# Patient Record
Sex: Female | Born: 1985 | Hispanic: Yes | Smoking: Former smoker
Health system: Southern US, Community
[De-identification: ages and names within clinical notes are randomized; demographics above are authoritative.]

## PROBLEM LIST (undated history)

## (undated) DIAGNOSIS — L409 Psoriasis, unspecified: Secondary | ICD-10-CM

## (undated) DIAGNOSIS — E0500 Thyrotoxicosis with diffuse goiter without thyrotoxic crisis or storm: Secondary | ICD-10-CM

## (undated) HISTORY — PX: TUBAL LIGATION: SHX77

---

## 2005-12-25 ENCOUNTER — Emergency Department: Payer: Self-pay | Admitting: Emergency Medicine

## 2006-07-20 ENCOUNTER — Emergency Department: Payer: Self-pay | Admitting: Unknown Physician Specialty

## 2007-05-28 ENCOUNTER — Emergency Department: Payer: Self-pay | Admitting: Emergency Medicine

## 2007-09-03 ENCOUNTER — Other Ambulatory Visit: Payer: Self-pay

## 2007-09-03 ENCOUNTER — Emergency Department: Payer: Self-pay | Admitting: Emergency Medicine

## 2008-11-13 ENCOUNTER — Emergency Department: Payer: Self-pay | Admitting: Emergency Medicine

## 2010-04-13 ENCOUNTER — Emergency Department: Payer: Self-pay | Admitting: Emergency Medicine

## 2010-10-23 ENCOUNTER — Emergency Department: Payer: Self-pay | Admitting: Emergency Medicine

## 2011-07-04 ENCOUNTER — Emergency Department: Payer: Self-pay | Admitting: Emergency Medicine

## 2011-08-02 ENCOUNTER — Emergency Department: Payer: Self-pay

## 2012-01-18 ENCOUNTER — Emergency Department: Payer: Self-pay | Admitting: Emergency Medicine

## 2012-01-18 LAB — URINALYSIS, COMPLETE
Bacteria: NONE SEEN
Bilirubin,UR: NEGATIVE
Ketone: NEGATIVE
Nitrite: NEGATIVE
Squamous Epithelial: 1
WBC UR: 17 /HPF (ref 0–5)

## 2012-04-06 ENCOUNTER — Emergency Department: Payer: Self-pay | Admitting: Emergency Medicine

## 2012-04-06 LAB — COMPREHENSIVE METABOLIC PANEL
Albumin: 4.2 g/dL (ref 3.4–5.0)
Anion Gap: 8 (ref 7–16)
Calcium, Total: 8.6 mg/dL (ref 8.5–10.1)
Chloride: 110 mmol/L — ABNORMAL HIGH (ref 98–107)
Co2: 26 mmol/L (ref 21–32)
EGFR (African American): >60
Glucose: 73 mg/dL (ref 65–99)
Osmolality: 284 (ref 275–301)
Potassium: 3.6 mmol/L (ref 3.5–5.1)
SGOT(AST): 26 U/L (ref 15–37)
Sodium: 144 mmol/L (ref 136–145)
Total Protein: 7.7 g/dL (ref 6.4–8.2)

## 2012-04-06 LAB — CBC
MCH: 30.1 pg (ref 26.0–34.0)
MCHC: 34 g/dL (ref 32.0–36.0)
MCV: 89 fL (ref 80–100)
Platelet: 242 10*3/uL (ref 150–440)
RBC: 4.81 10*6/uL (ref 3.80–5.20)
WBC: 9.1 10*3/uL (ref 3.6–11.0)

## 2012-04-06 LAB — URINALYSIS, COMPLETE
Bacteria: NONE SEEN
Bilirubin,UR: NEGATIVE
Blood: NEGATIVE
Glucose,UR: NEGATIVE mg/dL
Ketone: NEGATIVE
Nitrite: NEGATIVE
Ph: 6
Protein: NEGATIVE
RBC,UR: NONE SEEN /HPF
Specific Gravity: 1.014
Squamous Epithelial: 1
WBC UR: 2 /HPF

## 2012-04-06 LAB — PREGNANCY, URINE: Pregnancy Test, Urine: NEGATIVE m[IU]/mL

## 2012-06-19 ENCOUNTER — Emergency Department: Payer: Self-pay | Admitting: Emergency Medicine

## 2012-09-20 ENCOUNTER — Emergency Department: Payer: Self-pay | Admitting: Emergency Medicine

## 2012-09-20 LAB — CBC
HCT: 46.8 % (ref 35.0–47.0)
HGB: 15.8 g/dL (ref 12.0–16.0)
MCHC: 33.6 g/dL (ref 32.0–36.0)
MCV: 86 fL (ref 80–100)
RDW: 12.8 % (ref 11.5–14.5)

## 2012-09-20 LAB — COMPREHENSIVE METABOLIC PANEL
Albumin: 4.7 g/dL (ref 3.4–5.0)
Alkaline Phosphatase: 114 U/L (ref 50–136)
Anion Gap: 6 — ABNORMAL LOW (ref 7–16)
Bilirubin,Total: 0.4 mg/dL (ref 0.2–1.0)
Calcium, Total: 9.8 mg/dL (ref 8.5–10.1)
Chloride: 108 mmol/L — ABNORMAL HIGH (ref 98–107)
Co2: 25 mmol/L (ref 21–32)
EGFR (African American): >60
EGFR (Non-African Amer.): >60
Glucose: 88 mg/dL (ref 65–99)
Osmolality: 276 (ref 275–301)
Potassium: 3.9 mmol/L (ref 3.5–5.1)
SGPT (ALT): 20 U/L (ref 12–78)
Total Protein: 8.7 g/dL — ABNORMAL HIGH (ref 6.4–8.2)

## 2012-09-20 LAB — URINALYSIS, COMPLETE
Bilirubin,UR: NEGATIVE
Blood: NEGATIVE
Ph: 8 (ref 4.5–8.0)
Protein: 100
RBC,UR: 3 /HPF (ref 0–5)
Specific Gravity: 1.027 (ref 1.003–1.030)
Squamous Epithelial: 6
WBC UR: 2 /HPF (ref 0–5)

## 2012-10-18 ENCOUNTER — Emergency Department: Payer: Self-pay | Admitting: Emergency Medicine

## 2012-10-18 LAB — COMPREHENSIVE METABOLIC PANEL
Albumin: 4.2 g/dL (ref 3.4–5.0)
Calcium, Total: 9.8 mg/dL (ref 8.5–10.1)
EGFR (African American): >60
EGFR (Non-African Amer.): >60
Glucose: 90 mg/dL (ref 65–99)
Osmolality: 273 (ref 275–301)
Potassium: 4 mmol/L (ref 3.5–5.1)
SGOT(AST): 24 U/L (ref 15–37)
Sodium: 138 mmol/L (ref 136–145)

## 2012-10-18 LAB — CBC
HGB: 15.3 g/dL (ref 12.0–16.0)
MCH: 29.5 pg (ref 26.0–34.0)
MCHC: 34.7 g/dL (ref 32.0–36.0)
RDW: 12.9 % (ref 11.5–14.5)
WBC: 17.3 10*3/uL — ABNORMAL HIGH (ref 3.6–11.0)

## 2012-10-18 LAB — URINALYSIS, COMPLETE
Bilirubin,UR: NEGATIVE
Nitrite: POSITIVE
Protein: 75
RBC,UR: 501 /HPF (ref 0–5)
Specific Gravity: 1.02 (ref 1.003–1.030)
Squamous Epithelial: 4
Transitional Epi: 1
WBC UR: 2173 /HPF (ref 0–5)

## 2012-10-18 LAB — WET PREP, GENITAL

## 2012-10-18 LAB — LIPASE, BLOOD: Lipase: 124 U/L (ref 73–393)

## 2012-10-18 LAB — PREGNANCY, URINE: Pregnancy Test, Urine: NEGATIVE m[IU]/mL

## 2012-10-18 LAB — GC/CHLAMYDIA PROBE AMP

## 2012-10-20 LAB — URINE CULTURE

## 2012-10-23 LAB — CULTURE, BLOOD (SINGLE)

## 2013-10-01 ENCOUNTER — Emergency Department: Payer: Self-pay | Admitting: Emergency Medicine

## 2013-10-01 LAB — URINALYSIS, COMPLETE
Bacteria: NONE SEEN
Bilirubin,UR: NEGATIVE
Blood: NEGATIVE
GLUCOSE, UR: NEGATIVE mg/dL (ref 0–75)
KETONE: NEGATIVE
LEUKOCYTE ESTERASE: NEGATIVE
NITRITE: NEGATIVE
PH: 5 (ref 4.5–8.0)
Protein: NEGATIVE
SPECIFIC GRAVITY: 1.025 (ref 1.003–1.030)
WBC UR: 1 /HPF (ref 0–5)

## 2013-10-01 LAB — COMPREHENSIVE METABOLIC PANEL
ALK PHOS: 102 U/L
ALT: 17 U/L (ref 12–78)
AST: 18 U/L (ref 15–37)
Albumin: 3.8 g/dL (ref 3.4–5.0)
Anion Gap: 6 — ABNORMAL LOW (ref 7–16)
BUN: 10 mg/dL (ref 7–18)
Bilirubin,Total: 0.2 mg/dL (ref 0.2–1.0)
CALCIUM: 9.5 mg/dL (ref 8.5–10.1)
CO2: 28 mmol/L (ref 21–32)
Chloride: 104 mmol/L (ref 98–107)
Creatinine: 0.67 mg/dL (ref 0.60–1.30)
EGFR (African American): >60
Glucose: 77 mg/dL (ref 65–99)
OSMOLALITY: 274 (ref 275–301)
Potassium: 3.6 mmol/L (ref 3.5–5.1)
SODIUM: 138 mmol/L (ref 136–145)
Total Protein: 7.9 g/dL (ref 6.4–8.2)

## 2013-10-01 LAB — CBC WITH DIFFERENTIAL/PLATELET
BASOS ABS: 0.1 10*3/uL (ref 0.0–0.1)
BASOS PCT: 0.5 %
Eosinophil #: 0.4 10*3/uL (ref 0.0–0.7)
Eosinophil %: 2.8 %
HCT: 44.5 % (ref 35.0–47.0)
HGB: 14.5 g/dL (ref 12.0–16.0)
LYMPHS ABS: 2.6 10*3/uL (ref 1.0–3.6)
Lymphocyte %: 16.8 %
MCH: 29.2 pg (ref 26.0–34.0)
MCHC: 32.6 g/dL (ref 32.0–36.0)
MCV: 90 fL (ref 80–100)
MONOS PCT: 6.2 %
Monocyte #: 1 x10 3/mm — ABNORMAL HIGH (ref 0.2–0.9)
Neutrophil #: 11.3 10*3/uL — ABNORMAL HIGH (ref 1.4–6.5)
Neutrophil %: 73.7 %
PLATELETS: 258 10*3/uL (ref 150–440)
RBC: 4.95 10*6/uL (ref 3.80–5.20)
RDW: 12.6 % (ref 11.5–14.5)
WBC: 15.3 10*3/uL — ABNORMAL HIGH (ref 3.6–11.0)

## 2013-10-01 LAB — WET PREP, GENITAL

## 2013-10-01 LAB — GC/CHLAMYDIA PROBE AMP

## 2013-12-30 ENCOUNTER — Emergency Department: Payer: Self-pay | Admitting: Internal Medicine

## 2014-03-07 ENCOUNTER — Emergency Department: Payer: Self-pay | Admitting: Emergency Medicine

## 2014-03-07 LAB — CBC WITH DIFFERENTIAL/PLATELET
Basophil #: 0 10*3/uL (ref 0.0–0.1)
Basophil %: 0.6 %
EOS PCT: 1.8 %
Eosinophil #: 0.1 10*3/uL (ref 0.0–0.7)
HCT: 44.3 % (ref 35.0–47.0)
HGB: 14.5 g/dL (ref 12.0–16.0)
LYMPHS PCT: 21.6 %
Lymphocyte #: 1.5 10*3/uL (ref 1.0–3.6)
MCH: 28.7 pg (ref 26.0–34.0)
MCHC: 32.7 g/dL (ref 32.0–36.0)
MCV: 88 fL (ref 80–100)
MONO ABS: 0.4 x10 3/mm (ref 0.2–0.9)
Monocyte %: 5.7 %
Neutrophil #: 5 10*3/uL (ref 1.4–6.5)
Neutrophil %: 70.3 %
Platelet: 243 10*3/uL (ref 150–440)
RBC: 5.05 10*6/uL (ref 3.80–5.20)
RDW: 12.5 % (ref 11.5–14.5)
WBC: 7 10*3/uL (ref 3.6–11.0)

## 2014-03-07 LAB — COMPREHENSIVE METABOLIC PANEL
ALK PHOS: 109 U/L
ALT: 16 U/L
Albumin: 4.2 g/dL (ref 3.4–5.0)
Anion Gap: 8 (ref 7–16)
BILIRUBIN TOTAL: 0.4 mg/dL (ref 0.2–1.0)
BUN: 8 mg/dL (ref 7–18)
CREATININE: 0.7 mg/dL (ref 0.60–1.30)
Calcium, Total: 8.6 mg/dL (ref 8.5–10.1)
Chloride: 107 mmol/L (ref 98–107)
Co2: 24 mmol/L (ref 21–32)
EGFR (African American): >60
EGFR (Non-African Amer.): >60
GLUCOSE: 94 mg/dL (ref 65–99)
Osmolality: 276 (ref 275–301)
Potassium: 3.6 mmol/L (ref 3.5–5.1)
SGOT(AST): 14 U/L — ABNORMAL LOW (ref 15–37)
Sodium: 139 mmol/L (ref 136–145)
TOTAL PROTEIN: 7.8 g/dL (ref 6.4–8.2)

## 2014-03-07 LAB — URINALYSIS, COMPLETE
Bilirubin,UR: NEGATIVE
GLUCOSE, UR: NEGATIVE mg/dL (ref 0–75)
Leukocyte Esterase: NEGATIVE
NITRITE: NEGATIVE
PH: 6 (ref 4.5–8.0)
Protein: NEGATIVE
RBC,UR: 123 /HPF (ref 0–5)
Specific Gravity: 1.017 (ref 1.003–1.030)
WBC UR: 2 /HPF (ref 0–5)

## 2014-03-07 LAB — GC/CHLAMYDIA PROBE AMP

## 2014-03-07 LAB — WET PREP, GENITAL

## 2014-03-07 LAB — RAPID HIV SCREEN (HIV 1/2 AB+AG)

## 2014-03-07 LAB — LIPASE, BLOOD: LIPASE: 223 U/L (ref 73–393)

## 2015-03-27 ENCOUNTER — Encounter: Payer: Self-pay | Admitting: Emergency Medicine

## 2015-03-27 ENCOUNTER — Emergency Department
Admission: EM | Admit: 2015-03-27 | Discharge: 2015-03-27 | Disposition: A | Discharge status: Home / Self Care | Payer: Self-pay | Attending: Emergency Medicine | Admitting: Emergency Medicine

## 2015-03-27 DIAGNOSIS — Z720 Tobacco use: Secondary | ICD-10-CM | POA: Insufficient documentation

## 2015-03-27 DIAGNOSIS — L509 Urticaria, unspecified: Secondary | ICD-10-CM

## 2015-03-27 DIAGNOSIS — Y9389 Activity, other specified: Secondary | ICD-10-CM | POA: Insufficient documentation

## 2015-03-27 DIAGNOSIS — L500 Allergic urticaria: Secondary | ICD-10-CM | POA: Insufficient documentation

## 2015-03-27 DIAGNOSIS — Y9289 Other specified places as the place of occurrence of the external cause: Secondary | ICD-10-CM | POA: Insufficient documentation

## 2015-03-27 DIAGNOSIS — Y990 Civilian activity done for income or pay: Secondary | ICD-10-CM | POA: Insufficient documentation

## 2015-03-27 DIAGNOSIS — X58XXXA Exposure to other specified factors, initial encounter: Secondary | ICD-10-CM | POA: Insufficient documentation

## 2015-03-27 MED ORDER — DIPHENHYDRAMINE HCL 50 MG/ML IJ SOLN
25.0000 mg | Freq: Once | INTRAMUSCULAR | Status: AC
  Administered 2015-03-27: 25 mg via INTRAVENOUS
  Filled 2015-03-27: qty 1

## 2015-03-27 NOTE — ED Provider Notes (Addendum)
[facility] Emergency Department Provider Note  ____________________________________________   I have reviewed the triage vital signs and the nursing notes.   HISTORY  Chief Complaint Allergic Reaction    HPI Tracy Holden is a 29 y.o. female who is healthy, states that off and on for the last month or so she has had hives "out of the blue". Works as a Engineer, building services. Unsure what the cause is. Denies any tongue swelling or shortness of breath or other symptoms at any time. No insect envenomation. Patient's only complaint is hives mostly on her torso. No other issues today. Never had anaphylaxis in her life never needed an EpiPen  History reviewed. No pertinent past medical history.  There are no active problems to display for this patient.   History reviewed. No pertinent past surgical history.  No current outpatient prescriptions on file.  Allergies Review of patient's allergies indicates no known allergies.  History reviewed. No pertinent family history.  Social History Social History  Substance Use Topics  . Smoking status: Current Every Day Smoker -- 0.50 packs/day    Types: Cigarettes  . Smokeless tobacco: None  . Alcohol Use: 1.8 oz/week    3 Cans of beer per week    Review of Systems Constitutional: No fever/chills Eyes: No visual changes. ENT: No sore throat. No stiff neck no neck pain Cardiovascular: Denies chest pain. Respiratory: Denies shortness of breath. Gastrointestinal:   no vomiting.  No diarrhea.  No constipation. Genitourinary: Negative for dysuria. Musculoskeletal: Negative lower extremity swelling Skin: Negative for rash. Aside from hives Allergy: Hives Neurological: Negative for headaches, focal weakness or numbness. 10-point ROS otherwise negative.  ____________________________________________   PHYSICAL EXAM:  VITAL SIGNS: ED Triage Vitals  Enc Vitals Group     BP 03/27/15 1036 111/70 mmHg     Pulse Rate  03/27/15 1036 77     Resp --      Temp 03/27/15 1036 97.8 F (36.6 C)     Temp Source 03/27/15 1036 Oral     SpO2 03/27/15 1036 99 %     Weight 03/27/15 1036 143 lb (64.864 kg)     Height 03/27/15 1036 5\' 1"  (1.549 m)     Head Cir --      Peak Flow --      Pain Score --      Pain Loc --      Pain Edu? --      Excl. in [place]? --     Constitutional: Alert and oriented. Well appearing and in no acute distress. Eyes: Conjunctivae are normal. PERRL. EOMI. Head: Atraumatic. Nose: No congestion/rhinnorhea. Mouth/Throat: Mucous membranes are moist.  Oropharynx non-erythematous. There is no angioedema noted normal voice no stridor Neck: No stridor.   Nontender with no meningismus Cardiovascular: Normal rate, regular rhythm. Grossly normal heart sounds.  Good peripheral circulation. Respiratory: Normal respiratory effort.  No retractions. Lungs CTAB. Gastrointestinal: Soft and nontender. No distention. No guarding no rebound Back:  There is no focal tenderness or step off there is no midline tenderness there are no lesions noted. there is no CVA tenderness Musculoskeletal: No lower extremity tenderness. No joint effusions, no DVT signs strong distal pulses no edema Neurologic:  Normal speech and language. No gross focal neurologic deficits are appreciated.  Skin:  Skin is warm, dry and intact. Has noted to the upper torso and upper extremities Psychiatric: Mood and affect are normal. Speech and behavior are normal.  ____________________________________________   LABS (all labs ordered  are listed, but only abnormal results are displayed)  Labs Reviewed - No data to display ____________________________________________  EKG   ____________________________________________  RADIOLOGY   ____________________________________________   PROCEDURES  Procedure(s) performed: None  Critical Care performed: None  ____________________________________________   INITIAL IMPRESSION /  ASSESSMENT AND PLAN / ED COURSE  Pertinent labs & imaging results that were available during my care of the patient were reviewed by me and considered in my medical decision making (see chart for details).  Patient with hives unclear why, likely will need to follow-up with an allergist. No evidence of angioedema or anaphylaxis. No new medications. I have explained to her that we would likely not be able to find out why she has had hives in the emergency department. At no time she ever progressed anaphylaxis. Return precautions given for any worsening symptoms. Giving her IV Benadryl here and will reassess.  ----------------------------------------- 11:27 AM on 03/27/2015 -----------------------------------------  Patient remains in no acute distress, hives are completely gone after one of registration Benadryl. No evidence of anaphylaxis. Patient has had this over the last month multiple times. It always goes away with Benadryl. Do not take an EpiPen is warranted. Extensive return precautions and follow-up given and understood. We will refer her to an allergist. ____________________________________________   FINAL CLINICAL IMPRESSION(S) / ED DIAGNOSES  Final diagnoses:  None     Schuyler Amor, MD 03/27/15 [place], MD 03/27/15 [phone]

## 2015-03-27 NOTE — ED Notes (Signed)
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  Pt denies eating anything this morning but reports she drank an orange Gatorade.  Denies difficulty breathing.  No angioedema.

## 2015-03-27 NOTE — Discharge Instructions (Signed)
Ronchas  (Hives)  Las ronchas son reas de la piel inflamadas (hinchadas) rojas y que pican. Pueden cambiar de tamao y de ubicacin en el cuerpo. Las Administrator, Civil Service y Armed forces operational officer durante algunas horas o das (ronchas agudas) o durante algunas semanas (ronchas crnicas). No pueden transmitirse de [place] persona a Alcus Dad (no son contagiosas). Pueden empeorar al rascarse, hacer ejercicios y por estrs emocional.  CAUSAS   Reaccin alrgica a alimentos, aditivos o frmacos.  Infecciones, incluso el resfro comn.  Enfermedades, como la vasculitis, el lupus o la enfermedad tiroidea.  Exposicin al sol, al calor o al fro.  La prctica de ejercicios.  El estrs.  El contacto con algunas sustancias qumicas. SNTOMAS   Zonas hinchadas, rojas o blancas, sobre la piel. Las ronchas pueden cambiar de [place], forma, [place] y Chief Executive Officer.  Picazn.  Hinchazn de las [employer] y Ages. Esto puede ocurrir si las ronchas se desarrollan en capas profundas de la piel. DIAGNSTICO  El mdico puede diagnosticar el problema haciendo un examen fsico. [place] Comment indicar anlisis de sangre o un estudio de la piel para Office manager causa. En algunos casos, no puede determinarse la causa.  TRATAMIENTO  Los casos leves generalmente mejoran con medicamentos como los antihistamnicos. Los casos ms graves pueden requerir una inyeccin de epinefrina de Freight forwarder. Si se conoce la causa de la urticaria, el tratamiento incluye evitar el factor desencadenante.  INSTRUCCIONES PARA EL CUIDADO EN EL HOGAR   Evite las causas que han desencadenado las ronchas.  Tome los antihistamnicos segn las indicaciones del mdico para reducir la gravedad de las ronchas. Generalmente se recomiendan los [employer] no son sedantes o con bajo efecto sedante. No conduzca vehculos mientras toma antihistamnicos.  Tome los medicamentos para la picazn exactamente como le indic el  mdico.  Use ropas sueltas.  Cumpla con todas las visitas de control, segn le indique su mdico. SOLICITE ATENCIN MDICA SI:   Siente una picazn intensa o persistente que no se calma con los medicamentos.  [place] articulaciones o estn inflamadas. SOLICITE ATENCIN MDICA DE INMEDIATO SI:   Tiene fiebre.  Tiene la boca o los labios hinchados.  Tiene problemas para respirar o tragar.  Siente una opresin en la garganta o en el pecho.  Siente dolor abdominal. Estos problemas pueden ser los primeros signos de una reaccin alrgica que ponga en peligro la vida. Llame a los servicios de emergencia locales (911 en [place]). ASEGRESE DE QUE:   Comprende estas instrucciones.  Controlar su enfermedad.  Solicitar ayuda de inmediato si no mejora o si empeora.   Esta informacin no tiene Marine scientist el consejo del mdico. Asegrese de hacerle al mdico cualquier pregunta que tenga.   Document Released: 05/22/2005 Document Revised: 05/27/2013 Elsevier Interactive Patient Education [employer].

## 2015-09-25 ENCOUNTER — Emergency Department: Payer: Self-pay

## 2015-09-25 ENCOUNTER — Emergency Department
Admission: EM | Admit: 2015-09-25 | Discharge: 2015-09-25 | Disposition: A | Discharge status: Home / Self Care | Payer: Self-pay | Attending: Emergency Medicine | Admitting: Emergency Medicine

## 2015-09-25 ENCOUNTER — Encounter: Payer: Self-pay | Admitting: Emergency Medicine

## 2015-09-25 DIAGNOSIS — S93432A Sprain of tibiofibular ligament of left ankle, initial encounter: Secondary | ICD-10-CM | POA: Insufficient documentation

## 2015-09-25 DIAGNOSIS — Y929 Unspecified place or not applicable: Secondary | ICD-10-CM | POA: Insufficient documentation

## 2015-09-25 DIAGNOSIS — F17210 Nicotine dependence, cigarettes, uncomplicated: Secondary | ICD-10-CM | POA: Insufficient documentation

## 2015-09-25 DIAGNOSIS — S93492A Sprain of other ligament of left ankle, initial encounter: Secondary | ICD-10-CM

## 2015-09-25 DIAGNOSIS — Y9302 Activity, running: Secondary | ICD-10-CM | POA: Insufficient documentation

## 2015-09-25 DIAGNOSIS — Y999 Unspecified external cause status: Secondary | ICD-10-CM | POA: Insufficient documentation

## 2015-09-25 DIAGNOSIS — X501XXA Overexertion from prolonged static or awkward postures, initial encounter: Secondary | ICD-10-CM | POA: Insufficient documentation

## 2015-09-25 MED ORDER — MELOXICAM 15 MG PO TABS: 15.0000 mg | ORAL_TABLET | Freq: Every day | ORAL | Status: DC

## 2015-09-25 NOTE — ED Notes (Signed)
Pt to triage in wheelchair c/o left ankle pain s/p rolling ankle laterally while running (exercising).  Pt c/o 10/10 pain and swelling to left ankle.

## 2015-09-25 NOTE — ED Provider Notes (Signed)
[facility] Emergency Department Provider Note  ____________________________________________  Time seen: Approximately 10:22 PM  I have reviewed the triage vital signs and the nursing notes.   HISTORY  Chief Complaint Ankle Pain    HPI Tracy Holden is a 30 y.o. female who presents emergency department complaining of left ankle pain. Patient states that she injured her ankle in an inversion manner on running 2 days prior. Patient states that she continues to have sharp pain to the lateral aspect of left ankle. Patient does report some edema to same. She has been using an Ace bandage for symptom relief. Patient denies any other injury or complaint at this time.   History reviewed. No pertinent past medical history.  There are no active problems to display for this patient.   History reviewed. No pertinent past surgical history.  Current Outpatient Rx  Name  Route  Sig  Dispense  Refill  . meloxicam (MOBIC) 15 MG tablet   Oral   Take 1 tablet (15 mg total) by mouth daily.   30 tablet   0     Allergies Review of patient's allergies indicates no known allergies.  History reviewed. No pertinent family history.  Social History Social History  Substance Use Topics  . Smoking status: Current Every Day Smoker -- 0.50 packs/day    Types: Cigarettes  . Smokeless tobacco: None  . Alcohol Use: 1.8 oz/week    3 Cans of beer per week     Review of Systems  Constitutional: No fever/chills Cardiovascular: no chest pain. Respiratory: no cough. No SOB. Musculoskeletal: Positive for left ankle pain Skin: Negative for rash. Neurological: Negative for headaches, focal weakness or numbness. 10-point ROS otherwise negative.  ____________________________________________   PHYSICAL EXAM:  VITAL SIGNS: ED Triage Vitals  Enc Vitals Group     BP 09/25/15 2100 118/72 mmHg     Pulse Rate 09/25/15 2100 70     Resp 09/25/15 2100 18     Temp 09/25/15  2100 97.8 F (36.6 C)     Temp Source 09/25/15 2100 Oral     SpO2 09/25/15 2100 97 %     Weight 09/25/15 2100 145 lb (65.772 kg)     Height 09/25/15 2100 5\' 1"  (1.549 m)     Head Cir --      Peak Flow --      Pain Score 09/25/15 2100 10     Pain Loc --      Pain Edu? --      Excl. in [place]? --      Constitutional: Alert and oriented. Well appearing and in no acute distress. Eyes: Conjunctivae are normal. PERRL. EOMI. Head: Atraumatic. Cardiovascular: Normal rate, regular rhythm. Normal S1 and S2.  Good peripheral circulation. Respiratory: Normal respiratory effort without tachypnea or retractions. Lungs CTAB. Musculoskeletal: No visible deformity or edema noted to left ankle when compared with right. Patient has full range of motion to ankle. Patient is diffusely tender to palpation over the lateral aspect of the left foot. No specific point tenderness. Dorsalis pedis pulses appreciated. Sensation intact 5 digits. Neurologic:  Normal speech and language. No gross focal neurologic deficits are appreciated.  Skin:  Skin is warm, dry and intact. No rash noted. Psychiatric: Mood and affect are normal. Speech and behavior are normal. Patient exhibits appropriate insight and judgement.   ____________________________________________   LABS (all labs ordered are listed, but only abnormal results are displayed)  Labs Reviewed - No data to display ____________________________________________  EKG   ____________________________________________  RADIOLOGY Diamantina Providence Kavitha [place], personally viewed and evaluated these images (plain radiographs) as part of my medical decision making, as well as reviewing the written report by the radiologist.  Dg Ankle Complete Left  09/25/2015  CLINICAL DATA:  Left ankle pain after rolling ankle laterally. Stepped on a curb 2 days prior, pain since that time. EXAM: LEFT ANKLE COMPLETE - 3+ VIEW COMPARISON:  None. FINDINGS: There is no evidence of  fracture, dislocation, or joint effusion. There is no evidence of arthropathy or other focal bone abnormality. Soft tissues are unremarkable. IMPRESSION: Negative radiographs of the left ankle. Electronically Signed   By: Jeb Levering M.D.   On: 09/25/2015 21:59    ____________________________________________    PROCEDURES  Procedure(s) performed:       Medications - No data to display   ____________________________________________   INITIAL IMPRESSION / ASSESSMENT AND PLAN / ED COURSE  Pertinent labs & imaging results that were available during my care of the patient were reviewed by me and considered in my medical decision making (see chart for details).  Patient's diagnosis is consistent with posterior talofibular ligament ankle sprain to the left ankle. Exam is reassuring. X-ray reveals no acute osseous abnormality.. Patient will be discharged home with prescriptions for anti-inflammatories and instructions to use a brace to the left ankle. Patient is to follow up with orthopedics if symptoms persist past this treatment course. Patient is given ED precautions to return to the ED for any worsening or new symptoms.     ____________________________________________  FINAL CLINICAL IMPRESSION(S) / ED DIAGNOSES  Final diagnoses:  Sprain of posterior talofibular ligament of ankle, left, initial encounter      NEW MEDICATIONS STARTED DURING THIS VISIT:  Discharge Medication List as of 09/25/2015 10:31 PM    START taking these medications   Details  meloxicam (MOBIC) 15 MG tablet Take 1 tablet (15 mg total) by mouth daily., Starting 09/25/2015, Until Discontinued, Print            This chart was dictated using voice recognition software/Dragon. Despite best efforts to proofread, errors can occur which can change the meaning. Any change was purely unintentional.    Darletta Moll, PA-C 09/25/15 2312  Carrie Mew, MD 09/27/15 0021

## 2015-09-25 NOTE — Discharge Instructions (Signed)
Esguince de tobillo (Ankle Sprain)  Un esguince de tobillo es una lesin en los tejidos fuertes y fibrosos (ligamentos) que mantienen unidos los huesos de la articulacin del tobillo.  CAUSAS  Las causas pueden ser una cada o la torcedura del tobillo. Los esguinces de tobillo ocurren con ms frecuencia al pisar con el borde exterior del pie, lo que hace que el tobillo se vuelva hacia adentro. Las personas que practican deportes son ms propensas a este tipo de lesiones.  SNTOMAS   Dolor en el tobillo. El dolor puede aparecer durante el reposo o slo al tratar de ponerse de pie o caminar.  Hinchazn.  Hematomas. Los hematomas pueden aparecer inmediatamente o luego de 1 a 2 das despus de la lesin.  Dificultad para pararse o caminar, especialmente al doblar en esquinas o al cambiar de direccin. DIAGNSTICO  El mdico le preguntar detalles acerca de la lesin y le har un examen fsico del tobillo para determinar si tiene un esguince. Durante el examen fsico, el mdico apretar y Midwife presin en reas especficas del pie y del tobillo. El mdico tratar de Film/video editor tobillo en ciertas direcciones. Le indicarn una radiografa para descartar la fractura de un hueso o que un ligamento no se haya separado de uno de los huesos del tobillo (fractura por avulsin).  TRATAMIENTO  Algunos tipos de soporte podrn ayudarlo a estabilizar el tobillo. El profesional que lo asiste le dar las indicaciones. Tambin podr indicarle que use medicamentos para Glass blower/designer. Si el esguince es grave, su mdico podr derivarlo a un cirujano que lo ayudar a Secretary/administrator funcin de las partes afectadas del sistema esqueltico (ortopedista) o a un fisioterapeuta.  INSTRUCCIONES PARA EL CUIDADO EN EL HOGAR   Aplique hielo en la articulacin lesionada durante 1  2 das o segn lo que le indique su mdico. La aplicacin del hielo ayuda a reducir la inflamacin y Conservation officer, historic buildings.  Ponga el hielo en una bolsa  plstica.  Colquese una toalla entre la piel y la bolsa de hielo.  Deje el hielo en el lugar durante 15 a 20 minutos por vez, cada 2 horas mientras est despierto.  Slo tome medicamentos de venta libre o recetados para Glass blower/designer, las molestias o bajar la fiebre segn las indicaciones de su mdico.  Eleve el tobillo lesionado por encima del nivel del corazn tanto como pueda durante 2 o 3 das.  Si su mdico le indica el uso de [place], selas segn las instrucciones. Gradualmente lleve el peso sobre el tobillo [place]. Siga usando muletas o un bastn hasta que pueda caminar sin sentir dolor en el tobillo.  Si tiene una frula de yeso, sela como lo indique su mdico. No se apoye en ninguna cosa ms dura que una [employer] primeras 24 horas. No ponga peso sobre la frula. No permita que se moje. Puede quitrsela para tomar una ducha o un bao.  Pueden haberle colocado un vendaje elstico para usar alrededor del tobillo para darle soporte. Si el vendaje elstico est muy ajustado (siente adormecimiento u hormigueo o el pie est fro y [place]), ajstelo para que sea ms cmodo.  Si usted tiene una frula de [place], puede soplar o dejar salir el aire para que sea ms cmodo. Puede quitarse la frula por la noche y antes de tomar una ducha o un bao. Mueva los dedos de los pies en la frula varias veces al da para disminuir la hinchazn. SOLICITE ATENCIN MDICA SI:  Le aumenta rpidamente el [place].  Los dedos de los pies estn extremadamente fros o pierde la sensibilidad en el pie.  El dolor no se alivia con los [employer]. SOLICITE ATENCIN MDICA DE INMEDIATO SI:   Los dedos de los pies estn adormecidos o de [employer].  Tiene un dolor agudo que va aumentando. ASEGRESE DE QUE:   Comprende estas instrucciones.  Controlar su enfermedad.  Solicitar ayuda de inmediato si no mejora o empeora.   Esta informacin no tiene Marine scientist el  consejo del mdico. Asegrese de hacerle al mdico cualquier pregunta que tenga.   Document Released: 05/22/2005 Document Revised: 02/14/2012 Elsevier Interactive Patient Education 2016 [place] y RHCE Occupational hygienist Bandage and RICE) QU FUNCIN CUMPLE LA VENDA ELSTICA? Las vendas elsticas vienen de diferentes formas y Therapist, sports. Generalmente, sirven para sujetar la lesin y reducen la hinchazn mientras usted se recupera, pero pueden cumplir diferentes funciones. El mdico lo ayudar a decidir lo que es ms adecuado para su proteccin, recuperacin o rehabilitacin, despus de una lesin. CULES SON ALGUNOS CONSEJOS GENERALES PARA EL USO DE UNA VENDA ELSTICA?  Pngase la venda como lo indica el fabricante de la venda que est [place].  No la ajuste demasiado, ya que esto puede interrumpir la circulacin en la zona del brazo o de la pierna por debajo de la venda.  Si una parte del cuerpo ms all de la venda se torna color azul, se adormece, se enfra, se hincha o le causa ms dolor, es probable que la venda est [place]. Si eso ocurre, retire la venda y vuelva a colocarla ms floja.  Consulte al mdico si la venda parece estar agravando los problemas en lugar de mejorndolos.  La venda elstica debe quitarse y volver a ponerse cada 3 o 4horas, o como se lo haya indicado el mdico. QU SIGNIFICA LA SIGLA RHCE? Los cuidados de rutina de muchas lesiones incluyen reposo, hielo, compresin y elevacin (RHCE).  Reposo El reposo es necesario para permitir que el cuerpo se recupere. Generalmente, puede reanudar sus actividades habituales cuando se siente cmodo y el mdico se lo ha autorizado. Hielo Aplicar hielo en una lesin sirve para evitar la hinchazn y Therapist, art. No aplique el hielo directamente sobre la piel.  Ponga el hielo en una bolsa plstica.  Coloque una toalla entre la piel y la bolsa de hielo.  Coloque el hielo durante 43minutos, 2 a 3veces  por Training and development officer. Hgalo durante el tiempo que el mdico se lo haya indicado. Compresin La compresin ayuda a evitar la hinchazn, brinda sujecin y [place] con las molestias. Una venda elstica sirve para la compresin. Elevacin La elevacin ayuda a reducir la hinchazn y Therapist, art. Si es posible, la zona lesionada debe estar a nivel del corazn o del centro del pecho, o por encima de [place]. [place]? Debe buscar atencin mdica en las siguientes situaciones:  El dolor o la hinchazn persisten.  Los sntomas empeoran en vez de Teacher, English as a foreign language. Estos sntomas pueden indicar que es necesaria una evaluacin ms profunda o nuevas radiografas. En algunos casos, las radiografas no muestran si hay un hueso pequeo roto (fractura) hasta varios das ms tarde. Concurra a las citas de control con el mdico. Consulte con su mdico la fecha en que los [place] de las radiografas estarn disponibles. Asegrese de [employer] de las radiografas. [place]? Solicite atencin mdica inmediatamente en las siguientes situaciones:  Comienza a sentir sbitamente un dolor intenso en la zona de la lesin o por debajo de esta.  Aparece enrojecimiento o aumenta la hinchazn alrededor de la lesin.  Tiene hormigueo o adormecimiento en la zona de la lesin o por debajo de esta que no mejoran despus de quitarse la venda Counsellor.   Esta informacin no tiene Marine scientist el consejo del mdico. Asegrese de hacerle al mdico cualquier pregunta que tenga.   Document Released: 03/01/2005 Document Revised: 06/12/2014 Elsevier Interactive Patient Education 2016 [place] estribo para tobillo (Stirrup Sales executive) Un soporte de estribo para tobillo otorga soporte y ayuda a Chief Technology Officer articulacin del tobillo. Son piezas rgidas de plstico o fibra de vidrio que se colocan en ambos costados de la pierna, y la parte  inferior del estribo se ajusta cmodamente sobre la parte inferior del empeine del pie. Se pueden ajustar con cintas Velcro o elsticas. Los soportes de estribo para tobillo se [place] para soportar el tobillo luego de esguinces o torceduras, o fracturas luego de Buyer, retail.  Pueden removerse o ajustarse con facilidad si hay hinchazn. El Armed forces training and education officer rgido del soporte est diseado para que encaje cmodamente en el tobillo y provea la necesaria estabilizacin central/lateral. El soporte puede usarse con facilidad en la mayor parte de las zapatillas deportivas. El revestimiento del soporte normalmente est hecho de un material blando y cmodo, similar a un gel. El gel se amolda al tobillo sin provocar puntos de presin molestos.  LA IMPORTANCIA DEL SOPORTE PARA TOBILLO  El uso de un soporte para tobillo es efectivo para la prevencin de esguinces de tobillo.  En los atletas, el uso de un soporte para tobillo ofrecer proteccin y prevendr esguinces futuros.  Investigaciones muestran que un programa de rehabilitacin completo requiere la inclusin de un soporte externo. Esto incluye ejercicios de amplitud de movimiento y de fortalecimiento del tobillo. El profesional que lo asiste lo Copy. Si se le ha dado un soporte hoy para una nueva lesin, use la siguiente seccin de instrucciones para el cuidado domiciliario como una gua. INSTRUCCIONES PARA EL CUIDADO DOMICILIARIO  Aplique hielo sobre la lesin durante 15 a 20 minutos 3 a 4 veces por [employer] se encuentre despierto, durante los 2 primeros das. Ponga el hielo en una bolsa plstica y coloque una toalla entre la bolsa y la piel. Nunca coloque la bolsa de hielo directamente sobre la piel. Tenga especial cuidado al [employer] hielo en el codo o la rodilla o en otra zona en la que se palpe el hueso, como en el [place], debido a que el hielo aplicado durante mucho tiempo puede daar los nervios que se encuentran cerca de la  superficie.  Mantenga la pierna elevada cuando le sea posible para disminuir la hinchazn.  Use su tablilla hasta que acuda a la consulta de seguimiento. No le coloque peso. No la moje. Puede quitrsela para tomar una ducha o un bao.  Para realizar actividades: Utilice muletas y no soporte peso durante 1 semana. Luego podr caminar sobre el tobillo segn se le haya indicado. Comience gradualmente a soportar el peso sobre el tobillo [place].  Contine con el uso de [employer] o bastn hasta que pueda pararse sobre el tobillo sin Education officer, environmental.  Si puede, mueva los dedos de los pies en la tablilla varias veces al da.  El vendaje est demasiado apretado si siente entumecimiento, hormigueo o si su pie se vuelve fro y [place]. Ajuste la correa o vendaje elstico para  hacerlo ms confortable.  Utilice los medicamentos de venta libre o de prescripcin para Conservation officer, historic buildings, Health and safety inspector o la [place], segn se lo indique el profesional que lo asiste. SOLICITE ATENCIN MDICA DE INMEDIATO SI:  Aumenta el hematoma, la hinchazn o el dolor.  Sus dedos estn de Advice worker o se sienten fros y al aflojar el soporte o el vendaje no mejoran.  No consigue Best boy con la medicacin. EST SEGURO QUE:   Comprende las instrucciones para el alta mdica.  Controlar su enfermedad.  Solicitar atencin mdica de inmediato segn las indicaciones.   Esta informacin no tiene Marine scientist el consejo del mdico. Asegrese de hacerle al mdico cualquier pregunta que tenga.   Document Released: 03/19/2007 Document Revised: 08/14/2011 Elsevier Interactive Patient Education [employer].

## 2015-09-25 NOTE — ED Notes (Signed)
Patient transported to X-ray 

## 2017-06-18 ENCOUNTER — Emergency Department
Admission: EM | Admit: 2017-06-18 | Discharge: 2017-06-18 | Disposition: A | Discharge status: Home / Self Care | Payer: Self-pay | Attending: Emergency Medicine | Admitting: Emergency Medicine

## 2017-06-18 DIAGNOSIS — Z79899 Other long term (current) drug therapy: Secondary | ICD-10-CM | POA: Insufficient documentation

## 2017-06-18 DIAGNOSIS — Y929 Unspecified place or not applicable: Secondary | ICD-10-CM | POA: Insufficient documentation

## 2017-06-18 DIAGNOSIS — X503XXA Overexertion from repetitive movements, initial encounter: Secondary | ICD-10-CM

## 2017-06-18 DIAGNOSIS — S46912A Strain of unspecified muscle, fascia and tendon at shoulder and upper arm level, left arm, initial encounter: Secondary | ICD-10-CM | POA: Insufficient documentation

## 2017-06-18 DIAGNOSIS — X500XXA Overexertion from strenuous movement or load, initial encounter: Secondary | ICD-10-CM | POA: Insufficient documentation

## 2017-06-18 DIAGNOSIS — Y9389 Activity, other specified: Secondary | ICD-10-CM | POA: Insufficient documentation

## 2017-06-18 DIAGNOSIS — Y999 Unspecified external cause status: Secondary | ICD-10-CM | POA: Insufficient documentation

## 2017-06-18 DIAGNOSIS — F17210 Nicotine dependence, cigarettes, uncomplicated: Secondary | ICD-10-CM | POA: Insufficient documentation

## 2017-06-18 DIAGNOSIS — M79602 Pain in left arm: Secondary | ICD-10-CM

## 2017-06-18 MED ORDER — NAPROXEN 500 MG PO TABS
500.0000 mg | ORAL_TABLET | Freq: Once | ORAL | Status: AC
  Administered 2017-06-18: 500 mg via ORAL
  Filled 2017-06-18: qty 1

## 2017-06-18 MED ORDER — NAPROXEN 500 MG PO TABS: 500.0000 mg | ORAL_TABLET | Freq: Two times a day (BID) | ORAL | 1 refills | Status: AC

## 2017-06-18 MED ORDER — CYCLOBENZAPRINE HCL 5 MG PO TABS: 5.0000 mg | ORAL_TABLET | Freq: Every day | ORAL | 0 refills | Status: AC

## 2017-06-18 NOTE — Discharge Instructions (Signed)
Your exam is consistent with shoulder strain and muscle pain from overuse. You work activities have aggravated your muscles. There is no sign of rotator cuff injury. Take the prescription meds as directed. Follow-up with your provider or Dr. Sabra Heck for continued symptoms.

## 2017-06-18 NOTE — ED Triage Notes (Signed)
Patient c/o left shoulder pain X 1 month with increasing severity today. 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

## 2017-06-19 NOTE — ED Provider Notes (Signed)
[facility] Emergency Department Provider Note ____________________________________________  Time seen: 2100  I have reviewed the triage vital signs and the nursing notes.  HISTORY  Chief Complaint  Shoulder Pain (left)  HPI Rheanne Holden is a 32 y.o. female resents to the ED with a 1 month complaint of intermittent left shoulder pain that seems to be worsening the last 24 hours.  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  She is been in the same position for the last 8 months.  She is right-hand dominant but admits to carrying large trays of food in the left side.  She describes pain in the upper trapezius and upper back musculature that at times refers to the upper arm and forearm.  She denies any distal paresthesias, grip changes, or weakness.  She also denies any history of previous shoulder injury or ongoing chronic shoulder pain.  She has not taken any medications in the last month for her symptoms.  She presents now for further evaluation.  She denies any fevers, chest pain, shortness of breath, or injury by accident.  History reviewed. No pertinent past medical history.  There are no active problems to display for this patient.   Past Surgical History:  Procedure Laterality Date  . TUBAL LIGATION      Prior to Admission medications   Medication Sig Start Date End Date Taking? Authorizing Provider  cyclobenzaprine (FLEXERIL) 5 MG tablet Take 1 tablet (5 mg total) by mouth at bedtime for 15 days. 06/18/17 07/03/17  Josue Kass, Dannielle Karvonen, PA-C  meloxicam (MOBIC) 15 MG tablet Take 1 tablet (15 mg total) by mouth daily. 09/25/15   Cuthriell, Charline Bills, PA-C  naproxen (NAPROSYN) 500 MG tablet Take 1 tablet (500 mg total) by mouth 2 (two) times daily with a meal. 06/18/17 07/18/17  Shaliyah Taite, Dannielle Karvonen, PA-C    Allergies Patient has no known allergies.  No family history on file.  Social History Social History   Tobacco Use  .  Smoking status: Current Every Day Smoker    Packs/day: 0.50    Types: Cigarettes  . Smokeless tobacco: Never Used  Substance Use Topics  . Alcohol use: Yes    Alcohol/week: 1.8 oz    Types: 3 Cans of beer per week  . Drug use: Not on file    Review of Systems  Constitutional: Negative for fever. Cardiovascular: Negative for chest pain. Respiratory: Negative for shortness of breath. Musculoskeletal: Negative for back pain.  Left shoulder and arm pain as above. Skin: Negative for rash. Neurological: Negative for headaches, focal weakness or numbness. ____________________________________________  PHYSICAL EXAM:  VITAL SIGNS: ED Triage Vitals  Enc Vitals Group     BP 06/18/17 2011 122/80     Pulse Rate 06/18/17 2011 63     Resp 06/18/17 2011 17     Temp 06/18/17 2011 97.7 F (36.5 C)     Temp Source 06/18/17 2011 Oral     SpO2 06/18/17 2011 100 %     Weight 06/18/17 2013 145 lb (65.8 kg)     Height --      Head Circumference --      Peak Flow --      Pain Score 06/18/17 2012 10     Pain Loc --      Pain Edu? --      Excl. in [place]? --     Constitutional: Alert and oriented. Well appearing and in no distress. Head: Normocephalic and  atraumatic. Neck: Supple. No thyromegaly.  Range of motion without crepitus. Cardiovascular: Normal rate, regular rhythm. Normal distal pulses. Respiratory: Normal respiratory effort. No wheezes/rales/rhonchi. Musculoskeletal: Normal spinal alignment without midline tenderness, spasm, deformity, or step-off.  Left shoulder without any obvious deformity, dislocation, or sulcus sign.  Patient with full active range of motion of the left shoulder and arm.  No rotator cuff deficit are appreciated.  She is mildly tender to palpation to the upper trapezius and supraspinatus musculature.  Normal composite fist and grip strength bilaterally.  Nontender with normal range of motion in all extremities.  Neurologic: Cranial nerves II through XII grossly  intact.  Normal LE DTRs bilaterally.  Normal intrinsic and opposition testing noted.  Normal gait without ataxia. Normal speech and language. No gross focal neurologic deficits are appreciated. Skin:  Skin is warm, dry and intact. No rash noted. ____________________________________________   RADIOLOGY  Not indicated. ____________________________________________  PROCEDURES  Procedures Naproxen 500 mg PO ____________________________________________  INITIAL IMPRESSION / ASSESSMENT AND PLAN / ED COURSE  She with a ED evaluation of 1 month complaint of intermittent left shoulder and arm pain.  Patient's exam is essentially benign at this time.  No indication of any rotator cuff deficit or internal derangement of the shoulder.  Patient's exam and symptoms likely represent an overuse syndrome given her work activities.  She is advised to dose the prescription naproxen and muscle relaxant as directed.  She is also advised to consider doing daily shoulder range of motion exercises, and applying ice to the shoulder as needed.  She is referred to orthopedics for ongoing management of her symptoms. ____________________________________________  FINAL CLINICAL IMPRESSION(S) / ED DIAGNOSES  Final diagnoses:  Overuse syndrome of shoulder, left, initial encounter  Musculoskeletal arm pain, left      Carmie End, Dannielle Karvonen, PA-C 06/19/17 0046    Nena Polio, MD 06/19/17 1701

## 2018-07-12 ENCOUNTER — Observation Stay
Admission: EM | Admit: 2018-07-12 | Discharge: 2018-07-14 | Disposition: A | Discharge status: Home / Self Care | Payer: Self-pay | Attending: Surgery | Admitting: Surgery

## 2018-07-12 ENCOUNTER — Encounter: Payer: Self-pay | Admitting: Radiology

## 2018-07-12 ENCOUNTER — Other Ambulatory Visit: Payer: Self-pay

## 2018-07-12 ENCOUNTER — Emergency Department: Payer: Self-pay

## 2018-07-12 DIAGNOSIS — F17210 Nicotine dependence, cigarettes, uncomplicated: Secondary | ICD-10-CM | POA: Insufficient documentation

## 2018-07-12 DIAGNOSIS — K3580 Unspecified acute appendicitis: Principal | ICD-10-CM

## 2018-07-12 LAB — CBC
HEMATOCRIT: 44 % (ref 36.0–46.0)
HEMOGLOBIN: 14.8 g/dL (ref 12.0–15.0)
MCH: 27 pg (ref 26.0–34.0)
MCHC: 33.6 g/dL (ref 30.0–36.0)
MCV: 80.1 fL (ref 80.0–100.0)
NRBC: 0 % (ref 0.0–0.2)
Platelets: 261 10*3/uL (ref 150–400)
RBC: 5.49 MIL/uL — ABNORMAL HIGH (ref 3.87–5.11)
RDW: 12.2 % (ref 11.5–15.5)
WBC: 12 10*3/uL — AB (ref 4.0–10.5)

## 2018-07-12 LAB — COMPREHENSIVE METABOLIC PANEL
ALBUMIN: 4.1 g/dL (ref 3.5–5.0)
ALT: 17 U/L (ref 0–44)
ANION GAP: 9 (ref 5–15)
AST: 20 U/L (ref 15–41)
Alkaline Phosphatase: 148 U/L — ABNORMAL HIGH (ref 38–126)
BILIRUBIN TOTAL: 0.7 mg/dL (ref 0.3–1.2)
BUN: 5 mg/dL — AB (ref 6–20)
CHLORIDE: 104 mmol/L (ref 98–111)
CO2: 25 mmol/L (ref 22–32)
Calcium: 9.4 mg/dL (ref 8.9–10.3)
Creatinine, Ser: 0.38 mg/dL — ABNORMAL LOW (ref 0.44–1.00)
GFR calc Af Amer: >60 mL/min (ref >60)
GFR calc non Af Amer: >60 mL/min (ref >60)
Glucose, Bld: 118 mg/dL — ABNORMAL HIGH (ref 70–99)
POTASSIUM: 3 mmol/L — AB (ref 3.5–5.1)
SODIUM: 138 mmol/L (ref 135–145)
TOTAL PROTEIN: 7.3 g/dL (ref 6.5–8.1)

## 2018-07-12 LAB — URINALYSIS, COMPLETE (UACMP) WITH MICROSCOPIC
BILIRUBIN URINE: NEGATIVE
Glucose, UA: NEGATIVE mg/dL
Ketones, ur: NEGATIVE mg/dL
NITRITE: NEGATIVE
PROTEIN: NEGATIVE mg/dL
SPECIFIC GRAVITY, URINE: 1.01 (ref 1.005–1.030)
pH: 6 (ref 5.0–8.0)

## 2018-07-12 LAB — POCT PREGNANCY, URINE: Preg Test, Ur: NEGATIVE

## 2018-07-12 MED ORDER — POTASSIUM CHLORIDE CRYS ER 20 MEQ PO TBCR
40.0000 meq | EXTENDED_RELEASE_TABLET | Freq: Once | ORAL | Status: AC
  Administered 2018-07-13: 40 meq via ORAL
  Filled 2018-07-12: qty 2

## 2018-07-12 MED ORDER — ENOXAPARIN SODIUM 40 MG/0.4ML ~~LOC~~ SOLN
40.0000 mg | SUBCUTANEOUS | Status: DC
  Administered 2018-07-13 (×2): 40 mg via SUBCUTANEOUS
  Filled 2018-07-12 (×2): qty 0.4

## 2018-07-12 MED ORDER — KETOROLAC TROMETHAMINE 30 MG/ML IJ SOLN
30.0000 mg | Freq: Four times a day (QID) | INTRAMUSCULAR | Status: DC
  Administered 2018-07-13 – 2018-07-14 (×5): 30 mg via INTRAVENOUS
  Filled 2018-07-12 (×4): qty 1

## 2018-07-12 MED ORDER — ALPRAZOLAM 0.25 MG PO TABS
0.2500 mg | ORAL_TABLET | Freq: Two times a day (BID) | ORAL | Status: DC | PRN
  Administered 2018-07-13 (×2): 0.25 mg via ORAL
  Filled 2018-07-12 (×2): qty 1

## 2018-07-12 MED ORDER — PIPERACILLIN-TAZOBACTAM 3.375 G IVPB 30 MIN
3.3750 g | Freq: Once | INTRAVENOUS | Status: AC
  Administered 2018-07-12: 3.375 g via INTRAVENOUS
  Filled 2018-07-12: qty 50

## 2018-07-12 MED ORDER — ONDANSETRON HCL 4 MG/2ML IJ SOLN
4.0000 mg | Freq: Four times a day (QID) | INTRAMUSCULAR | Status: DC | PRN
  Administered 2018-07-13: 4 mg via INTRAVENOUS

## 2018-07-12 MED ORDER — ONDANSETRON HCL 4 MG/2ML IJ SOLN
4.0000 mg | Freq: Once | INTRAMUSCULAR | Status: AC
  Administered 2018-07-12: 4 mg via INTRAVENOUS
  Filled 2018-07-12: qty 2

## 2018-07-12 MED ORDER — ONDANSETRON 4 MG PO TBDP: 4.0000 mg | ORAL_TABLET | Freq: Four times a day (QID) | ORAL | Status: DC | PRN

## 2018-07-12 MED ORDER — POLYETHYLENE GLYCOL 3350 17 G PO PACK: 17.0000 g | PACK | Freq: Every day | ORAL | Status: DC | PRN

## 2018-07-12 MED ORDER — MORPHINE SULFATE (PF) 4 MG/ML IV SOLN
4.0000 mg | Freq: Once | INTRAVENOUS | Status: AC
  Administered 2018-07-12: 4 mg via INTRAVENOUS
  Filled 2018-07-12: qty 1

## 2018-07-12 MED ORDER — KETOROLAC TROMETHAMINE 30 MG/ML IJ SOLN
15.0000 mg | Freq: Once | INTRAMUSCULAR | Status: AC
Start: 2018-07-12 — End: 2018-07-12
  Administered 2018-07-12: 15 mg via INTRAVENOUS
  Filled 2018-07-12: qty 1

## 2018-07-12 MED ORDER — HYDROMORPHONE HCL 1 MG/ML IJ SOLN
0.5000 mg | INTRAMUSCULAR | Status: DC | PRN
  Administered 2018-07-13 (×2): 0.5 mg via INTRAVENOUS
  Filled 2018-07-12 (×2): qty 0.5

## 2018-07-12 MED ORDER — PANTOPRAZOLE SODIUM 40 MG IV SOLR
40.0000 mg | Freq: Every day | INTRAVENOUS | Status: DC
  Administered 2018-07-13 (×2): 40 mg via INTRAVENOUS
  Filled 2018-07-12 (×2): qty 40

## 2018-07-12 MED ORDER — SODIUM CHLORIDE 0.9 % IV BOLUS
1000.0000 mL | Freq: Once | INTRAVENOUS | Status: AC
  Administered 2018-07-12: 1000 mL via INTRAVENOUS

## 2018-07-12 MED ORDER — IOPAMIDOL (ISOVUE-300) INJECTION 61%
100.0000 mL | Freq: Once | INTRAVENOUS | Status: AC | PRN
  Administered 2018-07-12: 100 mL via INTRAVENOUS
  Filled 2018-07-12: qty 100

## 2018-07-12 MED ORDER — PIPERACILLIN-TAZOBACTAM 3.375 G IVPB: 3.3750 g | Freq: Three times a day (TID) | INTRAVENOUS | Status: DC

## 2018-07-12 MED ORDER — LACTATED RINGERS IV SOLN
125.0000 mL/h | INTRAVENOUS | Status: DC
  Administered 2018-07-13: 125 mL/h via INTRAVENOUS
  Administered 2018-07-13: 11:00:00 via INTRAVENOUS
  Administered 2018-07-13: 125 mL/h via INTRAVENOUS

## 2018-07-12 NOTE — ED Provider Notes (Addendum)
[facility] Emergency Department Provider Note  ____________________________________________  Time seen: Approximately 10:11 PM  I have reviewed the triage vital signs and the nursing notes.   HISTORY  Chief Complaint Pelvic Pain   HPI Tracy Holden is a 33 y.o. female no significant past medical history who presents for evaluation of abdominal pain.  Patient is complaining of diffuse sharp stabbing intermittent abdominal pain since last night.  She reports several episodes of nonbloody nonbilious emesis throughout the night.  The pain is severe. No fever, constipation, diarrhea, dysuria, hematuria, vaginal bleeding, vaginal discharge.  She reports that the pain feels like uterine contractions.  PMH None - reviewed  Past Surgical History:  Procedure Laterality Date  . TUBAL LIGATION      Prior to Admission medications   Medication Sig Start Date End Date Taking? Authorizing Provider  meloxicam (MOBIC) 15 MG tablet Take 1 tablet (15 mg total) by mouth daily. 09/25/15   Cuthriell, Charline Bills, PA-C    Allergies Patient has no known allergies.  No family history on file.  Social History Social History   Tobacco Use  . Smoking status: Current Every Day Smoker    Packs/day: 0.50    Types: Cigarettes  . Smokeless tobacco: Never Used  Substance Use Topics  . Alcohol use: Yes    Alcohol/week: 3.0 standard drinks    Types: 3 Cans of beer per week  . Drug use: Not on file    Review of Systems  Constitutional: Negative for fever. Eyes: Negative for visual changes. ENT: Negative for sore throat. Neck: No neck pain  Cardiovascular: Negative for chest pain. Respiratory: Negative for shortness of breath. Gastrointestinal: + abdominal pain, nausea, and vomiting. No diarrhea. Genitourinary: Negative for dysuria. Musculoskeletal: Negative for back pain. Skin: Negative for rash. Neurological: Negative for headaches, weakness or numbness. Psych: No  SI or HI  ____________________________________________   PHYSICAL EXAM:  VITAL SIGNS: ED Triage Vitals  Enc Vitals Group     BP 07/12/18 1946 140/87     Pulse Rate 07/12/18 1946 90     Resp 07/12/18 1946 16     Temp 07/12/18 1946 98.3 F (36.8 C)     Temp Source 07/12/18 1946 Oral     SpO2 07/12/18 1946 100 %     Weight 07/12/18 1947 130 lb (59 kg)     Height 07/12/18 1947 5\' 1"  (1.549 m)     Head Circumference --      Peak Flow --      Pain Score 07/12/18 1947 9     Pain Loc --      Pain Edu? --      Excl. in [place]? --     Constitutional: Alert and oriented. Well appearing and in no apparent distress. HEENT:      Head: Normocephalic and atraumatic.         Eyes: Conjunctivae are normal. Sclera is non-icteric.       Mouth/Throat: Mucous membranes are moist.       Neck: Supple with no signs of meningismus. Cardiovascular: Regular rate and rhythm. No murmurs, gallops, or rubs. 2+ symmetrical distal pulses are present in all extremities. No JVD. Respiratory: Normal respiratory effort. Lungs are clear to auscultation bilaterally. No wheezes, crackles, or rhonchi.  Gastrointestinal: Soft, mild diffuse tenderness to palpation with significant tenderness and localized guarding in the right lower quadrant, and non distended with positive bowel sounds. No rebound or guarding. Musculoskeletal: Nontender with normal range of  motion in all extremities. No edema, cyanosis, or erythema of extremities. Neurologic: Normal speech and language. Face is symmetric. Moving all extremities. No gross focal neurologic deficits are appreciated. Skin: Skin is warm, dry and intact. No rash noted. Psychiatric: Mood and affect are normal. Speech and behavior are normal.  ____________________________________________   LABS (all labs ordered are listed, but only abnormal results are displayed)  Labs Reviewed  COMPREHENSIVE METABOLIC PANEL - Abnormal; Notable for the following components:      Result  Value   Potassium 3.0 (*)    Glucose, Bld 118 (*)    BUN 5 (*)    Creatinine, Ser 0.38 (*)    Alkaline Phosphatase 148 (*)    All other components within normal limits  CBC - Abnormal; Notable for the following components:   WBC 12.0 (*)    RBC 5.49 (*)    All other components within normal limits  URINALYSIS, COMPLETE (UACMP) WITH MICROSCOPIC - Abnormal; Notable for the following components:   Color, Urine YELLOW (*)    APPearance CLEAR (*)    Hgb urine dipstick SMALL (*)    Leukocytes, UA TRACE (*)    Bacteria, UA RARE (*)    All other components within normal limits  POC URINE PREG, ED  POCT PREGNANCY, URINE   ____________________________________________  EKG  none  ____________________________________________  RADIOLOGY  I have personally reviewed the images performed during this visit and I agree with the Radiologist's read.   Interpretation by Radiologist:  Ct Abdomen Pelvis W Contrast  Result Date: 07/12/2018 CLINICAL DATA:  33 year old female with lower abdominal and pelvic pain since yesterday. EXAM: CT ABDOMEN AND PELVIS WITH CONTRAST TECHNIQUE: Multidetector CT imaging of the abdomen and pelvis was performed using the standard protocol following bolus administration of intravenous contrast. CONTRAST:  118mL ISOVUE-300 IOPAMIDOL (ISOVUE-300) INJECTION 61% COMPARISON:  CT Abdomen and Pelvis 09/20/2012. FINDINGS: Lower chest: Negative. Hepatobiliary: Negative liver and gallbladder. Pancreas: Negative. Spleen: Negative. Adrenals/Urinary Tract: Normal adrenal glands. Symmetric and normal renal enhancement. No hydronephrosis. Normal proximal ureters. Diminutive and unremarkable urinary bladder. Stomach/Bowel: Negative rectosigmoid colon. Negative descending colon and transverse colon. Mild retained stool in the right colon. Abnormal appendix which is mildly dilated to 8 millimeters, hyperenhancing, and surrounded by mild inflammation (series 5, image 31). Trace regional  reactive appearing free fluid. Appendix: Location: Along the caudal tip of the cecum. Diameter: 8 millimeters Appendicolith: Negative. Mucosal hyper-enhancement: Positive. Extraluminal gas: Negative. Periappendiceal collection: Stranding and trace fluid, no organized or drainable collection. Negative terminal ileum. No dilated small bowel. Negative stomach and duodenum. No free air. No free fluid in the upper abdomen. Vascular/Lymphatic: Major arterial structures are patent and normal. Portal venous system is patent. No lymphadenopathy. Reproductive: Fluid in the endometrial canal, and rounded 2-3 centimeter area of myometrial prominence near the fundus best seen on sagittal image 62. Ovaries appear negative. Other: Trace pelvic free fluid on the right (series 2, image 68). Musculoskeletal: Negative. IMPRESSION: 1. Acute Appendicitis. Peripancreatic inflammation, but negative for appendicolith, perforation, or abscess. 2. Trace pelvic free fluid which might be related to #1, or could be physiologic. Mild fluid in the endometrial canal, and probable 2-3 centimeter fundal fibroid. Electronically Signed   By: Genevie Ann M.D.   On: 07/12/2018 21:37     ____________________________________________   PROCEDURES  Procedure(s) performed: None Procedures Critical Care performed:  Yes  CRITICAL CARE Performed by: Rudene Re  ?  Total critical care time: 30 min  Critical care time was exclusive  of separately billable procedures and treating other patients.  Critical care was necessary to treat or prevent imminent or life-threatening deterioration.  Critical care was time spent personally by me on the following activities: development of treatment plan with patient and/or surrogate as well as nursing, discussions with consultants, evaluation of patient's response to treatment, examination of patient, obtaining history from patient or surrogate, ordering and performing treatments and interventions,  ordering and review of laboratory studies, ordering and review of radiographic studies, pulse oximetry and re-evaluation of patient's condition.  ____________________________________________   INITIAL IMPRESSION / ASSESSMENT AND PLAN / ED COURSE  33 y.o. female no significant past medical history who presents for evaluation of abdominal pain.  Patient with significant tenderness to palpation of the right lower quadrant concerning for appendicitis.  CT scan confirms acute uncomplicated appendicitis.  Discussed with Dr. Hampton Abbot who will admit patient for surgical intervention.  Patient was given fluids, Zosyn, morphine, and Zofran.  Patient and family were updated of the results of CT and need for surgery.      As part of my medical decision making, I reviewed the following data within the [place] notes reviewed and incorporated, Labs reviewed , Old chart reviewed, Radiograph reviewed , A consult was requested and obtained from this/these consultant(s) Surgery, Notes from prior ED visits and [place] Controlled Substance Database    Pertinent labs & imaging results that were available during my care of the patient were reviewed by me and considered in my medical decision making (see chart for details).    ____________________________________________   FINAL CLINICAL IMPRESSION(S) / ED DIAGNOSES  Final diagnoses:  Acute appendicitis, uncomplicated      NEW MEDICATIONS STARTED DURING THIS VISIT:  ED Discharge Orders    None       Note:  This document was prepared using Dragon voice recognition software and may include unintentional dictation errors.    Alfred Levins, [place], MD 07/12/18 [place], [place], MD 07/23/18 [phone]

## 2018-07-12 NOTE — ED Triage Notes (Signed)
Pt states lower abd to pelvic pain since yesterday. Pt denies known hematuria, vaginal bleeding/discharge. Pt states "it feels like contractions". Pt denies nausea. Last bowel movement yesterday.

## 2018-07-12 NOTE — Progress Notes (Signed)
07/12/18  Called tonight by Dr. Alfred Levins about this patient, presenting with abdominal pain starting last night.  Her workup has revealed acute appendicitis.  Her WBC is 12 and her CT scan shows a mildly dilated appendix to 8 mm, with periappendiceal stranding.  There is no appendicolith, perforation, or abscess.  I have independently viewed the patient's CT scan and agree with the findings.  She is afebrile with stable normal vital signs.  Will admit her, start her on IV antibiotics, and plan for OR tomorrow for laparoscopic appendectomy.  Full H&P to follow.   Olean Ree, MD

## 2018-07-13 ENCOUNTER — Encounter: Payer: Self-pay | Admitting: *Deleted

## 2018-07-13 ENCOUNTER — Observation Stay: Payer: Self-pay | Admitting: Anesthesiology

## 2018-07-13 ENCOUNTER — Encounter
Admission: EM | Disposition: A | Discharge status: Home / Self Care | Payer: Self-pay | Source: Home / Self Care | Attending: Emergency Medicine

## 2018-07-13 DIAGNOSIS — K3580 Unspecified acute appendicitis: Secondary | ICD-10-CM

## 2018-07-13 HISTORY — PX: LAPAROSCOPIC APPENDECTOMY: SHX408

## 2018-07-13 LAB — BASIC METABOLIC PANEL
Anion gap: 6 (ref 5–15)
BUN: 5 mg/dL — AB (ref 6–20)
CO2: 25 mmol/L (ref 22–32)
Calcium: 8.6 mg/dL — ABNORMAL LOW (ref 8.9–10.3)
Chloride: 109 mmol/L (ref 98–111)
Creatinine, Ser: 0.46 mg/dL (ref 0.44–1.00)
GFR calc Af Amer: >60 mL/min (ref >60)
GFR calc non Af Amer: >60 mL/min (ref >60)
Glucose, Bld: 89 mg/dL (ref 70–99)
POTASSIUM: 3.3 mmol/L — AB (ref 3.5–5.1)
Sodium: 140 mmol/L (ref 135–145)

## 2018-07-13 LAB — CBC
HCT: 37.8 % (ref 36.0–46.0)
Hemoglobin: 12.7 g/dL (ref 12.0–15.0)
MCH: 27 pg (ref 26.0–34.0)
MCHC: 33.6 g/dL (ref 30.0–36.0)
MCV: 80.3 fL (ref 80.0–100.0)
NRBC: 0 % (ref 0.0–0.2)
Platelets: 232 10*3/uL (ref 150–400)
RBC: 4.71 MIL/uL (ref 3.87–5.11)
RDW: 12.3 % (ref 11.5–15.5)
WBC: 10.2 10*3/uL (ref 4.0–10.5)

## 2018-07-13 LAB — MRSA PCR SCREENING: MRSA by PCR: NEGATIVE

## 2018-07-13 LAB — MAGNESIUM: Magnesium: 1.8 mg/dL (ref 1.7–2.4)

## 2018-07-13 SURGERY — APPENDECTOMY, LAPAROSCOPIC
Anesthesia: General

## 2018-07-13 MED ORDER — SUCCINYLCHOLINE CHLORIDE 20 MG/ML IJ SOLN
INTRAMUSCULAR | Status: DC | PRN
  Administered 2018-07-13: 100 mg via INTRAVENOUS

## 2018-07-13 MED ORDER — ROCURONIUM BROMIDE 100 MG/10ML IV SOLN
INTRAVENOUS | Status: DC | PRN
  Administered 2018-07-13: 10 mg via INTRAVENOUS
  Administered 2018-07-13: 40 mg via INTRAVENOUS

## 2018-07-13 MED ORDER — OXYCODONE HCL 5 MG PO TABS
5.0000 mg | ORAL_TABLET | ORAL | Status: DC | PRN
  Administered 2018-07-14: 5 mg via ORAL
  Filled 2018-07-13: qty 1

## 2018-07-13 MED ORDER — ACETAMINOPHEN 500 MG PO TABS: 1000.0000 mg | ORAL_TABLET | Freq: Four times a day (QID) | ORAL | Status: DC | PRN

## 2018-07-13 MED ORDER — PIPERACILLIN-TAZOBACTAM 3.375 G IVPB
3.3750 g | Freq: Three times a day (TID) | INTRAVENOUS | Status: DC
  Administered 2018-07-13 – 2018-07-14 (×3): 3.375 g via INTRAVENOUS
  Filled 2018-07-13 (×3): qty 50

## 2018-07-13 MED ORDER — BUPIVACAINE-EPINEPHRINE (PF) 0.5% -1:200000 IJ SOLN
INTRAMUSCULAR | Status: DC | PRN
  Administered 2018-07-13: 30 mL

## 2018-07-13 MED ORDER — ACETAMINOPHEN 10 MG/ML IV SOLN
INTRAVENOUS | Status: DC | PRN
  Administered 2018-07-13: 1000 mg via INTRAVENOUS

## 2018-07-13 MED ORDER — FENTANYL CITRATE (PF) 100 MCG/2ML IJ SOLN
25.0000 ug | INTRAMUSCULAR | Status: AC | PRN
  Administered 2018-07-13 (×6): 25 ug via INTRAVENOUS

## 2018-07-13 MED ORDER — MIDAZOLAM HCL 2 MG/2ML IJ SOLN
INTRAMUSCULAR | Status: AC
  Filled 2018-07-13: qty 2

## 2018-07-13 MED ORDER — ONDANSETRON HCL 4 MG/2ML IJ SOLN
INTRAMUSCULAR | Status: AC
  Filled 2018-07-13: qty 2

## 2018-07-13 MED ORDER — FENTANYL CITRATE (PF) 250 MCG/5ML IJ SOLN
INTRAMUSCULAR | Status: AC
  Filled 2018-07-13: qty 5

## 2018-07-13 MED ORDER — SODIUM CHLORIDE 0.9 % IV SOLN
INTRAVENOUS | Status: DC | PRN
  Administered 2018-07-13: 250 mL via INTRAVENOUS

## 2018-07-13 MED ORDER — FENTANYL CITRATE (PF) 100 MCG/2ML IJ SOLN
INTRAMUSCULAR | Status: DC | PRN
  Administered 2018-07-13: 50 ug via INTRAVENOUS

## 2018-07-13 MED ORDER — SUGAMMADEX SODIUM 200 MG/2ML IV SOLN
INTRAVENOUS | Status: DC | PRN
  Administered 2018-07-13: 127 mg via INTRAVENOUS

## 2018-07-13 MED ORDER — PROPOFOL 10 MG/ML IV BOLUS
INTRAVENOUS | Status: DC | PRN
  Administered 2018-07-13: 150 mg via INTRAVENOUS

## 2018-07-13 MED ORDER — DEXAMETHASONE SODIUM PHOSPHATE 10 MG/ML IJ SOLN
INTRAMUSCULAR | Status: DC | PRN
  Administered 2018-07-13: 10 mg via INTRAVENOUS

## 2018-07-13 MED ORDER — LIDOCAINE HCL (CARDIAC) PF 100 MG/5ML IV SOSY
PREFILLED_SYRINGE | INTRAVENOUS | Status: DC | PRN
  Administered 2018-07-13: 100 mg via INTRAVENOUS

## 2018-07-13 MED ORDER — PIPERACILLIN-TAZOBACTAM 3.375 G IVPB 30 MIN
3.3750 g | INTRAVENOUS | Status: AC
  Administered 2018-07-13: 3.375 g via INTRAVENOUS
  Filled 2018-07-13: qty 50

## 2018-07-13 MED ORDER — FENTANYL CITRATE (PF) 100 MCG/2ML IJ SOLN
INTRAMUSCULAR | Status: AC
  Filled 2018-07-13: qty 2

## 2018-07-13 MED ORDER — ONDANSETRON HCL 4 MG/2ML IJ SOLN
4.0000 mg | Freq: Once | INTRAMUSCULAR | Status: AC | PRN
  Administered 2018-07-13: 4 mg via INTRAVENOUS

## 2018-07-13 MED ORDER — MIDAZOLAM HCL 2 MG/2ML IJ SOLN
INTRAMUSCULAR | Status: DC | PRN
  Administered 2018-07-13: 2 mg via INTRAVENOUS

## 2018-07-13 SURGICAL SUPPLY — 38 items
CANISTER SUCT 1200ML W/VALVE (MISCELLANEOUS) ×3 IMPLANT
CHLORAPREP W/TINT 26ML (MISCELLANEOUS) ×3 IMPLANT
COVER WAND RF STERILE (DRAPES) IMPLANT
CUTTER FLEX LINEAR 45M (STAPLE) IMPLANT
DERMABOND ADVANCED (GAUZE/BANDAGES/DRESSINGS) ×2
DERMABOND ADVANCED .7 DNX12 (GAUZE/BANDAGES/DRESSINGS) ×1 IMPLANT
ELECT CAUTERY BLADE 6.4 (BLADE) ×3 IMPLANT
ELECT REM PT RETURN 9FT ADLT (ELECTROSURGICAL) ×3
ELECTRODE REM PT RTRN 9FT ADLT (ELECTROSURGICAL) ×1 IMPLANT
GLOVE SURG SYN 7.0 (GLOVE) ×3 IMPLANT
GLOVE SURG SYN 7.5  E (GLOVE) ×2
GLOVE SURG SYN 7.5 E (GLOVE) ×1 IMPLANT
GOWN STRL REUS W/ TWL LRG LVL3 (GOWN DISPOSABLE) ×2 IMPLANT
GOWN STRL REUS W/TWL LRG LVL3 (GOWN DISPOSABLE) ×4
IRRIGATION STRYKERFLOW (MISCELLANEOUS) ×1 IMPLANT
IRRIGATOR STRYKERFLOW (MISCELLANEOUS) ×3
IV NS 1000ML (IV SOLUTION) ×2
IV NS 1000ML BAXH (IV SOLUTION) ×1 IMPLANT
KIT TURNOVER KIT A (KITS) ×3 IMPLANT
LABEL OR SOLS (LABEL) ×3 IMPLANT
LIGASURE LAP MARYLAND 5MM 37CM (ELECTROSURGICAL) ×3 IMPLANT
NEEDLE HYPO 22GX1.5 SAFETY (NEEDLE) ×3 IMPLANT
NS IRRIG 500ML POUR BTL (IV SOLUTION) ×3 IMPLANT
PACK LAP CHOLECYSTECTOMY (MISCELLANEOUS) ×3 IMPLANT
PENCIL ELECTRO HAND CTR (MISCELLANEOUS) ×3 IMPLANT
POUCH SPECIMEN RETRIEVAL 10MM (ENDOMECHANICALS) ×3 IMPLANT
RELOAD 45 VASCULAR/THIN (ENDOMECHANICALS) IMPLANT
RELOAD STAPLE TA45 3.5 REG BLU (ENDOMECHANICALS) ×3 IMPLANT
SCISSORS METZENBAUM CVD 33 (INSTRUMENTS) IMPLANT
SET TUBE SMOKE EVAC HIGH FLOW (TUBING) ×3 IMPLANT
SLEEVE ADV FIXATION 5X100MM (TROCAR) ×6 IMPLANT
SUT MNCRL 4-0 (SUTURE) ×2
SUT MNCRL 4-0 27XMFL (SUTURE) ×1
SUT VICRYL 0 AB UR-6 (SUTURE) ×3 IMPLANT
SUTURE MNCRL 4-0 27XMF (SUTURE) ×1 IMPLANT
TRAY FOLEY MTR SLVR 16FR STAT (SET/KITS/TRAYS/PACK) ×3 IMPLANT
TROCAR BALLN GELPORT 12X130M (ENDOMECHANICALS) ×3 IMPLANT
TROCAR Z-THREAD OPTICAL 5X100M (TROCAR) ×3 IMPLANT

## 2018-07-13 NOTE — Anesthesia Post-op Follow-up Note (Signed)
Anesthesia QCDR form completed.        

## 2018-07-13 NOTE — Op Note (Signed)
  Procedure Date:  07/13/2018  Pre-operative Diagnosis:  Acute appendicitis  Post-operative Diagnosis: Acute appendicitis  Procedure:  Laparoscopic appendectomy  Surgeon:  Melvyn Neth, MD  Anesthesia:  General endotracheal  Estimated Blood Loss:  10 ml  Specimens:  appendix  Complications:  None  Indications for Procedure:  This is a 33 y.o. female who presents with abdominal pain and workup revealing acute appendicitis.  The options of surgery versus observation were reviewed with the patient and/or family. The risks of bleeding, infection, recurrence of symptoms, negative laparoscopy, potential for an open procedure, bowel injury, abscess or infection, were all discussed with the patient and she was willing to proceed.  Description of Procedure: The patient was correctly identified in the preoperative area and brought into the operating room.  The patient was placed supine with VTE prophylaxis in place.  Appropriate time-outs were performed.  Anesthesia was induced and the patient was intubated.  Foley catheter was placed.  Appropriate antibiotics were infused.  The abdomen was prepped and draped in a sterile fashion. An infraumbilical incision was made. A cutdown technique was used to enter the abdominal cavity without injury, and a Hasson trocar was inserted.  Pneumoperitoneum was obtained with appropriate opening pressures.  Two 5-mm ports were placed in the suprapubic and left lateral positions under direct visualization.  The right lower quadrant was inspected and the appendix was identified and found to be acutely inflamed.  The appendix was carefully dissected.  The mesoappendix was divided using the LigaSure.  The base of the appendix was dissected out and divided with a standard load Endo GIA.  The appendix was placed in an Endocatch bag.  The right lower quadrant was then inspected again revealing an intact staple line, no bleeding, and no bowel injury.  The area was  thoroughly irrigated.  The 5 mm ports were removed under direct visualization and the Hasson trocar was removed.  The Endocatch bag was brought out through the umbilical incision.  The fascial opening was closed using 0 vicryl suture.  Local anesthetic was infused in all incisions and the incisions were closed with 4-0 Monocryl.  The wounds were cleaned and sealed with DermaBond.  Foley catheter was removed and the patient was emerged from anesthesia and extubated and brought to the recovery room for further management.  The patient tolerated the procedure well and all counts were correct at the end of the case.   Melvyn Neth, MD

## 2018-07-13 NOTE — Transfer of Care (Signed)
Immediate Anesthesia Transfer of Care Note  Patient: Alphonsine Minium  Procedure(s) Performed: APPENDECTOMY LAPAROSCOPIC (N/A )  Patient Location: PACU  Anesthesia Type:General  Level of Consciousness: sedated  Airway & Oxygen Therapy: Patient Spontanous Breathing and Patient connected to face mask oxygen  Post-op Assessment: Report given to RN and Post -op Vital signs reviewed and stable  Post vital signs: Reviewed and stable  Last Vitals:  Vitals Value Taken Time  BP    Temp    Pulse    Resp    SpO2      Last Pain:  Vitals:   07/13/18 0826  TempSrc:   PainSc: 6          Complications: No apparent anesthesia complications

## 2018-07-13 NOTE — H&P (Signed)
Date of Admission:  07/13/2018  Reason for Admission:  Acute appendicitis  History of Present Illness: Tracy Holden is a 33 y.o. female presented with a two day history of abdominal pain, in the right lower quadrant, associated with nausea and vomiting on the first day.  Denies any fevers or chills, chest pain, shortness of breath, nausea, vomiting.  Pain started in the umbilical area and moved to the right lower quadrant.  Presented to the ED and her workup revealed acute appendicitis.  CT scan independently viewed by me and agree with the findings.  Her WBC was elevated at 12.  Was admitted overnight and placed on IV antibiotics for surgery today.  Past Medical History: None  Past Surgical History: Past Surgical History:  Procedure Laterality Date  . TUBAL LIGATION      Home Medications: Prior to Admission medications   None    Allergies: No Known Allergies  Social History:  reports that she has been smoking cigarettes. She has been smoking about 0.50 packs per day. She has never used smokeless tobacco. She reports current alcohol use of about 3.0 standard drinks of alcohol per week. No history on file for drug.   Family History: History reviewed. No pertinent family history.  Review of Systems: Review of Systems  Constitutional: Negative for chills and fever.  HENT: Negative for hearing loss.   Respiratory: Negative for shortness of breath.   Cardiovascular: Negative for chest pain.  Gastrointestinal: Positive for abdominal pain, nausea and vomiting. Negative for constipation and diarrhea.  Genitourinary: Negative for dysuria.  Musculoskeletal: Negative for myalgias.  Skin: Negative for rash.  Neurological: Negative for dizziness.  Psychiatric/Behavioral: Negative for depression.    Physical Exam BP (!) 94/53 (BP Location: Right Arm)   Pulse 73   Temp 98.4 F (36.9 C) (Oral)   Resp 16   Ht 5\' 1"  (1.549 m)   Wt 63.5 kg   LMP 06/28/2018   SpO2 99%   BMI 26.45  kg/m  CONSTITUTIONAL: No acute distress HEENT:  Normocephalic, atraumatic, extraocular motion intact. NECK: Trachea is midline, and there is no jugular venous distension.  RESPIRATORY:  Lungs are clear, and breath sounds are equal bilaterally. Normal respiratory effort without pathologic use of accessory muscles. CARDIOVASCULAR: Heart is regular without murmurs, gallops, or rubs. GI: The abdomen is soft, non-distended, tender to palpation in the right lower quadrant. There were no palpable masses.  MUSCULOSKELETAL:  Normal muscle strength and tone in all four extremities.  No peripheral edema or cyanosis. SKIN: Skin turgor is normal. There are no pathologic skin lesions.  NEUROLOGIC:  Motor and sensation is grossly normal.  Cranial nerves are grossly intact. PSYCH:  Alert and oriented to person, place and time. Affect is normal.  Laboratory Analysis: Results for orders placed or performed during the hospital encounter of 07/12/18 (from the past 24 hour(s))  Comprehensive metabolic panel     Status: Abnormal   Collection Time: 07/12/18  7:50 PM  Result Value Ref Range   Sodium 138 135 - 145 mmol/L   Potassium 3.0 (L) 3.5 - 5.1 mmol/L   Chloride 104 98 - 111 mmol/L   CO2 25 22 - 32 mmol/L   Glucose, Bld 118 (H) 70 - 99 mg/dL   BUN 5 (L) 6 - 20 mg/dL   Creatinine, Ser 0.38 (L) 0.44 - 1.00 mg/dL   Calcium 9.4 8.9 - 10.3 mg/dL   Total Protein 7.3 6.5 - 8.1 g/dL   Albumin 4.1 3.5 - 5.0  g/dL   AST 20 15 - 41 U/L   ALT 17 0 - 44 U/L   Alkaline Phosphatase 148 (H) 38 - 126 U/L   Total Bilirubin 0.7 0.3 - 1.2 mg/dL   GFR calc non Af Amer >60 >60 mL/min   GFR calc Af Amer >60 >60 mL/min   Anion gap 9 5 - 15  CBC     Status: Abnormal   Collection Time: 07/12/18  7:50 PM  Result Value Ref Range   WBC 12.0 (H) 4.0 - 10.5 K/uL   RBC 5.49 (H) 3.87 - 5.11 MIL/uL   Hemoglobin 14.8 12.0 - 15.0 g/dL   HCT 44.0 36.0 - 46.0 %   MCV 80.1 80.0 - 100.0 fL   MCH 27.0 26.0 - 34.0 pg   MCHC 33.6 30.0  - 36.0 g/dL   RDW 12.2 11.5 - 15.5 %   Platelets 261 150 - 400 K/uL   nRBC 0.0 0.0 - 0.2 %  Urinalysis, Complete w Microscopic     Status: Abnormal   Collection Time: 07/12/18  7:50 PM  Result Value Ref Range   Color, Urine YELLOW (A) YELLOW   APPearance CLEAR (A) CLEAR   Specific Gravity, Urine 1.010 1.005 - 1.030   pH 6.0 5.0 - 8.0   Glucose, UA NEGATIVE NEGATIVE mg/dL   Hgb urine dipstick SMALL (A) NEGATIVE   Bilirubin Urine NEGATIVE NEGATIVE   Ketones, ur NEGATIVE NEGATIVE mg/dL   Protein, ur NEGATIVE NEGATIVE mg/dL   Nitrite NEGATIVE NEGATIVE   Leukocytes, UA TRACE (A) NEGATIVE   RBC / HPF 0-5 0 - 5 RBC/hpf   WBC, UA 0-5 0 - 5 WBC/hpf   Bacteria, UA RARE (A) NONE SEEN   Squamous Epithelial / LPF 0-5 0 - 5   Mucus PRESENT   Pregnancy, urine POC     Status: None   Collection Time: 07/12/18  7:53 PM  Result Value Ref Range   Preg Test, Ur NEGATIVE NEGATIVE  MRSA PCR Screening     Status: None   Collection Time: 07/13/18 12:27 AM  Result Value Ref Range   MRSA by PCR NEGATIVE NEGATIVE  Basic metabolic panel     Status: Abnormal   Collection Time: 07/13/18 12:54 AM  Result Value Ref Range   Sodium 140 135 - 145 mmol/L   Potassium 3.3 (L) 3.5 - 5.1 mmol/L   Chloride 109 98 - 111 mmol/L   CO2 25 22 - 32 mmol/L   Glucose, Bld 89 70 - 99 mg/dL   BUN 5 (L) 6 - 20 mg/dL   Creatinine, Ser 0.46 0.44 - 1.00 mg/dL   Calcium 8.6 (L) 8.9 - 10.3 mg/dL   GFR calc non Af Amer >60 >60 mL/min   GFR calc Af Amer >60 >60 mL/min   Anion gap 6 5 - 15  Magnesium     Status: None   Collection Time: 07/13/18 12:54 AM  Result Value Ref Range   Magnesium 1.8 1.7 - 2.4 mg/dL  CBC     Status: None   Collection Time: 07/13/18 12:54 AM  Result Value Ref Range   WBC 10.2 4.0 - 10.5 K/uL   RBC 4.71 3.87 - 5.11 MIL/uL   Hemoglobin 12.7 12.0 - 15.0 g/dL   HCT 37.8 36.0 - 46.0 %   MCV 80.3 80.0 - 100.0 fL   MCH 27.0 26.0 - 34.0 pg   MCHC 33.6 30.0 - 36.0 g/dL   RDW 12.3 11.5 - 15.5 %  Platelets 232 150 - 400 K/uL   nRBC 0.0 0.0 - 0.2 %    Imaging: Ct Abdomen Pelvis W Contrast  Result Date: 07/12/2018 CLINICAL DATA:  33 year old female with lower abdominal and pelvic pain since yesterday. EXAM: CT ABDOMEN AND PELVIS WITH CONTRAST TECHNIQUE: Multidetector CT imaging of the abdomen and pelvis was performed using the standard protocol following bolus administration of intravenous contrast. CONTRAST:  129mL ISOVUE-300 IOPAMIDOL (ISOVUE-300) INJECTION 61% COMPARISON:  CT Abdomen and Pelvis 09/20/2012. FINDINGS: Lower chest: Negative. Hepatobiliary: Negative liver and gallbladder. Pancreas: Negative. Spleen: Negative. Adrenals/Urinary Tract: Normal adrenal glands. Symmetric and normal renal enhancement. No hydronephrosis. Normal proximal ureters. Diminutive and unremarkable urinary bladder. Stomach/Bowel: Negative rectosigmoid colon. Negative descending colon and transverse colon. Mild retained stool in the right colon. Abnormal appendix which is mildly dilated to 8 millimeters, hyperenhancing, and surrounded by mild inflammation (series 5, image 31). Trace regional reactive appearing free fluid. Appendix: Location: Along the caudal tip of the cecum. Diameter: 8 millimeters Appendicolith: Negative. Mucosal hyper-enhancement: Positive. Extraluminal gas: Negative. Periappendiceal collection: Stranding and trace fluid, no organized or drainable collection. Negative terminal ileum. No dilated small bowel. Negative stomach and duodenum. No free air. No free fluid in the upper abdomen. Vascular/Lymphatic: Major arterial structures are patent and normal. Portal venous system is patent. No lymphadenopathy. Reproductive: Fluid in the endometrial canal, and rounded 2-3 centimeter area of myometrial prominence near the fundus best seen on sagittal image 62. Ovaries appear negative. Other: Trace pelvic free fluid on the right (series 2, image 68). Musculoskeletal: Negative. IMPRESSION: 1. Acute Appendicitis.  Peripancreatic inflammation, but negative for appendicolith, perforation, or abscess. 2. Trace pelvic free fluid which might be related to #1, or could be physiologic. Mild fluid in the endometrial canal, and probable 2-3 centimeter fundal fibroid. Electronically Signed   By: Genevie Ann M.D.   On: 07/12/2018 21:37    Assessment and Plan: This is a 33 y.o. female with acute appendicitis.  Patient was admitted overnight to the surgical team.  She was NPO with IV fluid hydration and IV antibiotics, appropriate pain and nausea control.  Discussed with the patient the role for laparoscopic appendectomy, including risks of bleeding, infection, and injury to surrounding structures.  Discussed with her post-op stay in hospital and antibiotics, as well as possibility of needing a drain.  She understands and is in agreement.  She will go to OR today.   Melvyn Neth, MD  [facility]:  [phone]

## 2018-07-13 NOTE — Anesthesia Postprocedure Evaluation (Signed)
Anesthesia Post Note  Patient: Tracy Holden  Procedure(s) Performed: APPENDECTOMY LAPAROSCOPIC (N/A )  Patient location during evaluation: PACU Anesthesia Type: General Level of consciousness: awake and alert Pain management: pain level controlled Vital Signs Assessment: post-procedure vital signs reviewed and stable Respiratory status: spontaneous breathing, nonlabored ventilation, respiratory function stable and patient connected to nasal cannula oxygen Cardiovascular status: blood pressure returned to baseline and stable Postop Assessment: no apparent nausea or vomiting Anesthetic complications: no     Last Vitals:  Vitals:   07/13/18 1348 07/13/18 1359  BP:  (!) 104/55  Pulse: 76 72  Resp: 19 12  Temp:    SpO2: 96% 99%    Last Pain:  Vitals:   07/13/18 1359  TempSrc:   PainSc: Asleep                 Molli Barrows

## 2018-07-13 NOTE — Anesthesia Preprocedure Evaluation (Signed)
Anesthesia Evaluation  Patient identified by MRN, date of birth, ID band Patient awake    Reviewed: Allergy & Precautions, H&P , NPO status , Patient's Chart, lab work & pertinent test results, reviewed documented beta blocker date and time   Airway Mallampati: II  TM Distance: >3 FB Neck ROM: full    Dental  (+) Teeth Intact   Pulmonary neg pulmonary ROS, Current Smoker,    Pulmonary exam normal        Cardiovascular negative cardio ROS Normal cardiovascular exam Rhythm:regular Rate:Normal     Neuro/Psych negative neurological ROS  negative psych ROS   GI/Hepatic negative GI ROS, Neg liver ROS,   Endo/Other  negative endocrine ROS  Renal/GU negative Renal ROS  negative genitourinary   Musculoskeletal   Abdominal   Peds  Hematology negative hematology ROS (+)   Anesthesia Other Findings History reviewed. No pertinent past medical history. Past Surgical History: No date: TUBAL LIGATION BMI    Body Mass Index:  26.45 kg/m     Reproductive/Obstetrics negative OB ROS                             Anesthesia Physical Anesthesia Plan  ASA: II and emergent  Anesthesia Plan: General ETT   Post-op Pain Management:    Induction:   PONV Risk Score and Plan:   Airway Management Planned:   Additional Equipment:   Intra-op Plan:   Post-operative Plan:   Informed Consent: I have reviewed the patients History and Physical, chart, labs and discussed the procedure including the risks, benefits and alternatives for the proposed anesthesia with the patient or authorized representative who has indicated his/her understanding and acceptance.     Dental Advisory Given  Plan Discussed with: CRNA  Anesthesia Plan Comments:         Anesthesia Quick Evaluation

## 2018-07-13 NOTE — Anesthesia Procedure Notes (Signed)
Procedure Name: Intubation Date/Time: 07/13/2018 12:01 PM Performed by: Nelda Marseille, CRNA Pre-anesthesia Checklist: Patient identified, Patient being monitored, Timeout performed, Emergency Drugs available and Suction available Patient Re-evaluated:Patient Re-evaluated prior to induction Oxygen Delivery Method: Circle system utilized Preoxygenation: Pre-oxygenation with 100% oxygen Induction Type: IV induction Ventilation: Mask ventilation without difficulty Laryngoscope Size: Mac, 3 and McGraph Grade View: Grade II Tube type: Oral Tube size: 7.0 mm Number of attempts: 1 Airway Equipment and Method: Stylet and Video-laryngoscopy Placement Confirmation: ETT inserted through vocal cords under direct vision,  positive ETCO2 and breath sounds checked- equal and bilateral Secured at: 21 cm Tube secured with: Tape Dental Injury: Teeth and Oropharynx as per pre-operative assessment  Difficulty Due To: Difficulty was anticipated and Difficult Airway- due to anterior larynx

## 2018-07-14 ENCOUNTER — Encounter: Payer: Self-pay | Admitting: Surgery

## 2018-07-14 MED ORDER — IBUPROFEN 400 MG PO TABS: 600.0000 mg | ORAL_TABLET | Freq: Three times a day (TID) | ORAL | Status: DC | PRN

## 2018-07-14 MED ORDER — AMOXICILLIN-POT CLAVULANATE 875-125 MG PO TABS
1.0000 | ORAL_TABLET | Freq: Two times a day (BID) | ORAL | Status: DC
  Administered 2018-07-14: 1 via ORAL
  Filled 2018-07-14: qty 1

## 2018-07-14 MED ORDER — IBUPROFEN 600 MG PO TABS: 600.0000 mg | ORAL_TABLET | Freq: Three times a day (TID) | ORAL | 0 refills | Status: DC | PRN

## 2018-07-14 MED ORDER — AMOXICILLIN-POT CLAVULANATE 875-125 MG PO TABS: 1.0000 | ORAL_TABLET | Freq: Two times a day (BID) | ORAL | 0 refills | Status: DC

## 2018-07-14 MED ORDER — OXYCODONE HCL 5 MG PO TABS: 5.0000 mg | ORAL_TABLET | ORAL | 0 refills | Status: DC | PRN

## 2018-07-14 NOTE — Discharge Summary (Signed)
Patient ID: Tracy Holden MRN: [id number] DOB/AGE: 07-20-1985 33 y.o.  Admit date: 07/12/2018 Discharge date: 07/14/2018   Discharge Diagnoses:  Active Problems:   Acute appendicitis, uncomplicated   Procedures:  Laparoscopic appendectomy  Hospital Course:  Patient was admitted on 2/7 with acute appendicitis and was taken to OR on 2/8 for laparoscopic appendectomy.  Tolerated surgery well.  Post-op recovery was uneventful.  Her diet was slowly advanced, she had bowel function, pain was well controlled, and was deemed ready for discharge.    On exam, she was in no acute distress with stable vital signs.  Her abdomen was soft, non-distended, appropriately tender to palpation.  Incisions were clean, dry, intact.  Consults: None  Disposition: Discharge disposition: 01-Home or Self Care       Discharge Instructions    Call MD for:  difficulty breathing, headache or visual disturbances   Complete by:  As directed    Call MD for:  persistant nausea and vomiting   Complete by:  As directed    Call MD for:  redness, tenderness, or signs of infection (pain, swelling, redness, odor or green/yellow discharge around incision site)   Complete by:  As directed    Call MD for:  severe uncontrolled pain   Complete by:  As directed    Call MD for:  temperature >100.4   Complete by:  As directed    Diet - low sodium heart healthy   Complete by:  As directed    Discharge instructions   Complete by:  As directed    1.  Patient may shower, but do not scrub wounds heavily and dab dry only. 2.  Do not submerge wounds in pool/tub for 1 week. 3.  Do not apply ointments or hydrogen peroxide to the wounds.   Driving Restrictions   Complete by:  As directed    Do not drive while taking narcotics for pain control.   Increase activity slowly   Complete by:  As directed    Lifting restrictions   Complete by:  As directed    No heavy lifting or pushing of more than 10-15 lbs for 4 weeks.   No  dressing needed   Complete by:  As directed      Allergies as of 07/14/2018   No Known Allergies     Medication List    TAKE these medications   amoxicillin-clavulanate 875-125 MG tablet Commonly known as:  AUGMENTIN Take 1 tablet by mouth every 12 (twelve) hours.   ibuprofen 600 MG tablet Commonly known as:  ADVIL,MOTRIN Take 1 tablet (600 mg total) by mouth every 8 (eight) hours as needed for fever or mild pain.   oxyCODONE 5 MG immediate release tablet Commonly known as:  Oxy IR/ROXICODONE Take 1 tablet (5 mg total) by mouth every 4 (four) hours as needed for severe pain.      Follow-up Information    Shamaria Kavan, Jacqulyn Bath, MD Follow up in 2 week(s).   Specialty:  General Surgery Contact information: [address] [place] [address] [phone]

## 2018-07-15 ENCOUNTER — Telehealth: Payer: Self-pay | Admitting: Surgery

## 2018-07-15 NOTE — Telephone Encounter (Signed)
Left a message for the patient to call the office, patient needs to be schedule a  Follow up in  2 weeks for appendectomy laparoscopic done on 07/13/2018 with Dr. Hampton Abbot.

## 2018-07-16 LAB — HIV ANTIBODY (ROUTINE TESTING W REFLEX): HIV SCREEN 4TH GENERATION: NONREACTIVE

## 2018-07-17 LAB — SURGICAL PATHOLOGY

## 2018-07-26 ENCOUNTER — Encounter: Payer: Self-pay | Admitting: Surgery

## 2018-08-02 ENCOUNTER — Other Ambulatory Visit: Payer: Self-pay

## 2018-08-02 ENCOUNTER — Encounter: Payer: Self-pay | Admitting: Surgery

## 2018-08-02 ENCOUNTER — Ambulatory Visit (INDEPENDENT_AMBULATORY_CARE_PROVIDER_SITE_OTHER): Payer: Self-pay | Admitting: Surgery

## 2018-08-02 VITALS — BP 118/79 | HR 72 | Temp 97.7°F | Resp 14 | Ht 61.0 in | Wt 133.4 lb

## 2018-08-02 DIAGNOSIS — Z09 Encounter for follow-up examination after completed treatment for conditions other than malignant neoplasm: Secondary | ICD-10-CM

## 2018-08-02 DIAGNOSIS — K3580 Unspecified acute appendicitis: Secondary | ICD-10-CM

## 2018-08-02 NOTE — Patient Instructions (Addendum)

## 2018-08-02 NOTE — Progress Notes (Signed)
08/02/2018  HPI: Aamira Bischoff is a 33 y.o. female s/p laparoscopic appendectomy on 07/13/18.  Presents today for follow up.  She was discharged with oral abx course which she has completed.  She reports that she's having mushy stools twice a day, were looser earlier and are improving.  No abdominal pain or discomfort.  Tolerating diet.  Vital signs: BP 118/79   Pulse 72   Temp 97.7 F (36.5 C) (Temporal)   Resp 14   Ht 5\' 1"  (1.549 m)   Wt 133 lb 6.4 oz (60.5 kg)   SpO2 97%   BMI 25.21 kg/m    Physical Exam: Constitutional: No acute distress Abdomen:  Soft, non-distended, non-tender to palpation.  Incisions are clean, dry, intact with no evidence of infection.    Assessment/Plan: This is a 33 y.o. female s/p laparoscopic appendectomy.  --Reviewed pathology with patient -- acute appendicitis with suppurative serositis, but negative for malignancy. --Patient still has one more week of no heavy lifting or pushing.  May return to normal activities at work on 08/12/18. --May follow up prn.   Melvyn Neth, [facility]

## 2019-06-14 ENCOUNTER — Emergency Department: Payer: No Typology Code available for payment source

## 2019-06-14 ENCOUNTER — Encounter: Payer: Self-pay | Admitting: Emergency Medicine

## 2019-06-14 ENCOUNTER — Emergency Department
Admission: EM | Admit: 2019-06-14 | Discharge: 2019-06-14 | Disposition: A | Discharge status: Home / Self Care | Payer: No Typology Code available for payment source | Attending: Emergency Medicine | Admitting: Emergency Medicine

## 2019-06-14 ENCOUNTER — Other Ambulatory Visit: Payer: Self-pay

## 2019-06-14 DIAGNOSIS — M25532 Pain in left wrist: Secondary | ICD-10-CM | POA: Insufficient documentation

## 2019-06-14 DIAGNOSIS — F17210 Nicotine dependence, cigarettes, uncomplicated: Secondary | ICD-10-CM | POA: Insufficient documentation

## 2019-06-14 MED ORDER — IBUPROFEN 600 MG PO TABS: 600.0000 mg | ORAL_TABLET | Freq: Three times a day (TID) | ORAL | 0 refills | Status: AC | PRN

## 2019-06-14 MED ORDER — TRAMADOL HCL 50 MG PO TABS: 50.0000 mg | ORAL_TABLET | Freq: Two times a day (BID) | ORAL | 0 refills | Status: AC | PRN

## 2019-06-14 NOTE — ED Provider Notes (Signed)
[facility] Emergency Department Provider Note   ____________________________________________   First MD Initiated Contact with Patient 06/14/19 1158     (approximate)  I have reviewed the triage vital signs and the nursing notes.   HISTORY  Chief Complaint Wrist Injury and Hand Pain    HPI Tracy Holden is a 34 y.o. female patient complain of left hand and wrist pain secondary to a fall yesterday.  Patient states she hyperextended her wrist on the fall.  Patient denies loss of sensation or loss of function.  Patient pain is at the distal radius.  Patient rates pain a 7/10.  Patient described pain is "achy".  No palliative measures prior to arrival.  Patient is right-hand dominant.         History reviewed. No pertinent past medical history.  Patient Active Problem List   Diagnosis Date Noted  . Acute appendicitis, uncomplicated 123456    Past Surgical History:  Procedure Laterality Date  . LAPAROSCOPIC APPENDECTOMY N/A 07/13/2018   Procedure: APPENDECTOMY LAPAROSCOPIC;  Surgeon: Olean Ree, MD;  Location: [facility] ORS;  Service: General;  Laterality: N/A;  . TUBAL LIGATION      Prior to Admission medications   Medication Sig Start Date End Date Taking? Authorizing Provider  ibuprofen (ADVIL) 600 MG tablet Take 1 tablet (600 mg total) by mouth every 8 (eight) hours as needed for fever or mild pain. 06/14/19   Sable Feil, PA-C  traMADol (ULTRAM) 50 MG tablet Take 1 tablet (50 mg total) by mouth every 12 (twelve) hours as needed for up to 3 days. 06/14/19 06/17/19  Sable Feil, PA-C    Allergies Patient has no known allergies.  No family history on file.  Social History Social History   Tobacco Use  . Smoking status: Current Every Day Smoker    Packs/day: 0.50    Types: Cigarettes  . Smokeless tobacco: Never Used  Substance Use Topics  . Alcohol use: Yes    Alcohol/week: 3.0 standard drinks    Types: 3 Cans of beer per week    . Drug use: Not on file    Review of Systems Constitutional: No fever/chills Eyes: No visual changes. ENT: No sore throat. Cardiovascular: Denies chest pain. Respiratory: Denies shortness of breath. Gastrointestinal: No abdominal pain.  No nausea, no vomiting.  No diarrhea.  No constipation. Genitourinary: Negative for dysuria. Musculoskeletal: Left wrist pain.   Skin: Negative for rash. Neurological: Negative for headaches, focal weakness or numbness.   ____________________________________________   PHYSICAL EXAM:  VITAL SIGNS: ED Triage Vitals  Enc Vitals Group     BP 06/14/19 1149 126/73     Pulse Rate 06/14/19 1149 87     Resp 06/14/19 1149 18     Temp 06/14/19 1149 98.7 F (37.1 C)     Temp Source 06/14/19 1149 Oral     SpO2 06/14/19 1149 100 %     Weight 06/14/19 1151 135 lb (61.2 kg)     Height 06/14/19 1151 5\' 1"  (1.549 m)     Head Circumference --      Peak Flow --      Pain Score 06/14/19 1151 7     Pain Loc --      Pain Edu? --      Excl. in [employer]? --    Constitutional: Alert and oriented. Well appearing and in no acute distress. Cardiovascular: Normal rate, regular rhythm. Grossly normal heart sounds.  Good peripheral circulation. Respiratory: Normal respiratory  effort.  No retractions. Lungs CTAB. Musculoskeletal: No obvious deformity to the left wrist.  Patient has full equal range of motion through pain.  Moderate guarding palpation of distal radius.   Neurologic:  Normal speech and language. No gross focal neurologic deficits are appreciated. No gait instability. Skin:  Skin is warm, dry and intact. No rash noted. Psychiatric: Mood and affect are normal. Speech and behavior are normal.  ____________________________________________   LABS (all labs ordered are listed, but only abnormal results are displayed)  Labs Reviewed - No data to  display ____________________________________________  EKG   ____________________________________________  RADIOLOGY  ED MD interpretation:    Official radiology report(s): DG Hand Complete Left  Result Date: 06/14/2019 CLINICAL DATA:  Fall a few days prior with left hand/wrist pain EXAM: LEFT HAND - COMPLETE 3+ VIEW COMPARISON:  None. FINDINGS: Slight cortical irregularity in the dorsal distal left radius on the lateral view, cannot exclude a nondisplaced fracture in this location. No fracture or dislocation in the left hand. No suspicious focal osseous lesions. No significant arthropathy. No radiopaque foreign bodies. IMPRESSION: 1. Slight cortical irregularity in the dorsal distal left radius on the lateral view, equivocal for nondisplaced fracture. Suggest dedicated left wrist radiographs. 2. No left hand fracture or malalignment. Electronically Signed   By: Ilona Sorrel M.D.   On: 06/14/2019 12:45    ____________________________________________   PROCEDURES  Procedure(s) performed (including Critical Care):  Procedures   ____________________________________________   INITIAL IMPRESSION / ASSESSMENT AND PLAN / ED COURSE  As part of my medical decision making, I reviewed the following data within the [place]     Patient presents with left wrist pain secondary to a fall which occurred yesterday.  Discussed x-ray findings with patient consistent with slight irregularity of the cortical aspects of the distal radius.  No acute fracture.  Patient placed in Velcro wrist splint and given discharge care instructions.  [facility]    Tracy Holden was evaluated in Emergency Department on 06/14/2019 for the symptoms described in the history of present illness. She was evaluated in the context of the global COVID-19 pandemic, which necessitated consideration that the patient might be at risk for infection with the SARS-CoV-2  virus that causes COVID-19. Institutional protocols and algorithms that pertain to the evaluation of patients at risk for COVID-19 are in a state of rapid change based on information released by regulatory bodies including the CDC and federal and state organizations. These policies and algorithms were followed during the patient's care in the ED.       ____________________________________________   FINAL CLINICAL IMPRESSION(S) / ED DIAGNOSES  Final diagnoses:  Left wrist pain     ED Discharge Orders         Ordered    traMADol (ULTRAM) 50 MG tablet  Every 12 hours PRN     06/14/19 1255    ibuprofen (ADVIL) 600 MG tablet  Every 8 hours PRN     06/14/19 1255           Note:  This document was prepared using Dragon voice recognition software and may include unintentional dictation errors.    Sable Feil, PA-C 06/14/19 1301    Vanessa [place], MD 06/15/19 [phone]

## 2019-06-14 NOTE — ED Notes (Signed)
Pt states she injured her wrist at home falling a few days ago. Then she re-injured it at work yesterday. Pt states she wants to file it as worker's comp.

## 2019-06-14 NOTE — ED Triage Notes (Signed)
Fell yesterday, pain L hand and wrist.

## 2019-06-14 NOTE — ED Notes (Addendum)
Spoke with Rondel Jumbo - mgr at [facility]. They were assuming this was a pre-existing condition since it was injured at home and then re-injured at work. If she files under worker's comp they do need a uds.

## 2019-06-14 NOTE — Discharge Instructions (Signed)
Wear wrist splint for 3 to 5 days as needed.

## 2020-05-14 ENCOUNTER — Other Ambulatory Visit: Payer: Self-pay

## 2020-05-14 ENCOUNTER — Encounter: Payer: Self-pay | Admitting: *Deleted

## 2020-05-14 DIAGNOSIS — F17210 Nicotine dependence, cigarettes, uncomplicated: Secondary | ICD-10-CM | POA: Insufficient documentation

## 2020-05-14 DIAGNOSIS — J069 Acute upper respiratory infection, unspecified: Secondary | ICD-10-CM | POA: Insufficient documentation

## 2020-05-14 DIAGNOSIS — Z20822 Contact with and (suspected) exposure to covid-19: Secondary | ICD-10-CM | POA: Insufficient documentation

## 2020-05-14 LAB — RESP PANEL BY RT-PCR (FLU A&B, COVID) ARPGX2
Influenza A by PCR: NEGATIVE
Influenza B by PCR: NEGATIVE
SARS Coronavirus 2 by RT PCR: NEGATIVE

## 2020-05-14 MED ORDER — ACETAMINOPHEN 325 MG PO TABS
650.0000 mg | ORAL_TABLET | Freq: Once | ORAL | Status: AC | PRN
  Administered 2020-05-14: 650 mg via ORAL
  Filled 2020-05-14: qty 2

## 2020-05-14 NOTE — ED Triage Notes (Signed)
Pt says since Tuesday runny nose, sinus congestion and sneezing. Says she feels like she has a temperature. No meds PTA.

## 2020-05-15 ENCOUNTER — Emergency Department
Admission: EM | Admit: 2020-05-15 | Discharge: 2020-05-15 | Disposition: A | Discharge status: Home / Self Care | Payer: Self-pay | Attending: Emergency Medicine | Admitting: Emergency Medicine

## 2020-05-15 DIAGNOSIS — J069 Acute upper respiratory infection, unspecified: Secondary | ICD-10-CM

## 2020-05-15 NOTE — Discharge Instructions (Signed)
As we discussed, you tested negative for Covid and influenza.  You likely have some other virus causing your congestion.   Please take Tylenol and ibuprofen/Advil for your pain.  It is safe to take them together, or to alternate them every few hours.  Take up to 1000mg  of Tylenol at a time, up to 4 times per day.  Do not take more than 4000 mg of Tylenol in 24 hours.  For ibuprofen, take 400-600 mg, 4-5 times per day.  Use the above medications to treat any fevers and discomfort.  You may also use over-the-counter Sudafed, or its generic equivalent, for your congestion.  Push fluids to maintain your hydration.  If you develop any worsening symptoms despite the above medications, please return to the ED.

## 2020-05-15 NOTE — ED Notes (Signed)
Pt D/c by MD, d/c instructions provided to pt.

## 2020-05-15 NOTE — ED Provider Notes (Signed)
[facility] Emergency Department Provider Note ____________________________________________   Event Date/Time   First MD Initiated Contact with Patient 05/15/20 [phone]     (approximate)  I have reviewed the triage vital signs and the nursing notes.  HISTORY  Chief Complaint Nasal Congestion   HPI Tracy Holden is a 34 y.o. femalewho presents to the ED for evaluation of upper respiratory congestion.  Chart review indicates no relevant medical history.  Patient reports about 4 days of upper respiratory congestion, frequent sneezing, clear rhinorrhea and subjective chills.  She was concerned for COVID-19 and requests a test.  She denies any significant shortness of breath, chest pain, abdominal pain, emesis, diarrhea, dysuria, syncopal episodes, traumas or falls.  She reports transient relief of her symptoms with home NyQuil, but the congestion always returns.  She reports a mild headache, improved with Tylenol.   History reviewed. No pertinent past medical history.  Patient Active Problem List   Diagnosis Date Noted  . Acute appendicitis, uncomplicated 28/31/5176    Past Surgical History:  Procedure Laterality Date  . LAPAROSCOPIC APPENDECTOMY N/A 07/13/2018   Procedure: APPENDECTOMY LAPAROSCOPIC;  Surgeon: Olean Ree, MD;  Location: [facility] ORS;  Service: General;  Laterality: N/A;  . TUBAL LIGATION      Prior to Admission medications   Medication Sig Start Date End Date Taking? Authorizing Provider  ibuprofen (ADVIL) 600 MG tablet Take 1 tablet (600 mg total) by mouth every 8 (eight) hours as needed for fever or mild pain. 06/14/19   Sable Feil, PA-C    Allergies Patient has no known allergies.  No family history on file.  Social History Social History   Tobacco Use  . Smoking status: Current Every Day Smoker    Packs/day: 0.50    Types: Cigarettes  . Smokeless tobacco: Never Used  Substance Use Topics  . Alcohol use: Yes     Alcohol/week: 3.0 standard drinks    Types: 3 Cans of beer per week    Review of Systems  Constitutional: Positive for subjective chills Eyes: No visual changes. ENT: No sore throat.  Positive for upper respiratory congestion. Cardiovascular: Denies chest pain. Respiratory: Denies shortness of breath. Gastrointestinal: No abdominal pain.  No nausea, no vomiting.  No diarrhea.  No constipation. Genitourinary: Negative for dysuria. Musculoskeletal: Negative for back pain. Skin: Negative for rash. Neurological: Negative for headaches, focal weakness or numbness.   ____________________________________________   PHYSICAL EXAM:  VITAL SIGNS: Vitals:   05/15/20 0225 05/15/20 0611  BP: 115/65 124/75  Pulse: 77 75  Resp: 18 18  Temp:  98 F (36.7 C)  SpO2: 97% 97%    Constitutional: Alert and oriented.  Appears uncomfortable.  Conversational full sentences. Eyes: Conjunctivae are normal. PERRL. EOMI. Head: Atraumatic. Nose: Congestion and clear rhinorrhea is present.  No overlying skin changes to the midface.  No bony step-offs or severe tenderness.  No signs of EOM entrapment. Mouth/Throat: Mucous membranes are moist.  Oropharynx non-erythematous. Neck: No stridor. No cervical spine tenderness to palpation. Cardiovascular: Normal rate, regular rhythm. Grossly normal heart sounds.  Good peripheral circulation. Respiratory: Normal respiratory effort.  No retractions. Lungs CTAB. Gastrointestinal: Soft , nondistended, nontender to palpation. No CVA tenderness. Musculoskeletal: No lower extremity tenderness nor edema.  No joint effusions. No signs of acute trauma. Neurologic:  Normal speech and language. No gross focal neurologic deficits are appreciated. No gait instability noted. Skin:  Skin is warm, dry and intact. No rash noted. Psychiatric: Mood and affect are  normal. Speech and behavior are normal. ____________________________________________   LABS (all labs ordered are  listed, but only abnormal results are displayed)  Labs Reviewed  RESP PANEL BY RT-PCR (FLU A&B, COVID) [id number]   ____________________________________________  RADIOLOGY  ED MD interpretation:   Official radiology report(s): No results found.  ____________________________________________   PROCEDURES and INTERVENTIONS  Procedure(s) performed (including Critical Care):  Procedures  Medications  acetaminophen (TYLENOL) tablet 650 mg (650 mg Oral Given 05/14/20 2139)    ____________________________________________   MDM / ED COURSE   34 year old female presents to the ED with few days of upper respiratory congestion, without evidence of COVID-19, and amenable to outpatient management.  Low-grade temperature upon arrival, clearing after Tylenol ministration.  Exam demonstrates evidence of upper respiratory congestion, but no signs of distress.  Symptoms have been going on for less than a week and she appears nontoxic without evidence of bacterial sinusitis.  She has no signs or symptoms of lower respiratory tract disease.  No further evidence of acute pathology.  Covid test is negative.  Discussed her likely viral URI.  We discussed outpatient management and return precautions for the ED.  Medically stable for discharge home.    ____________________________________________   FINAL CLINICAL IMPRESSION(S) / ED DIAGNOSES  Final diagnoses:  Viral upper respiratory tract infection  Viral URI with cough     ED Discharge Orders    None       Juliane Guest   Note:  This document was prepared using Dragon voice recognition software and may include unintentional dictation errors.   Vladimir Crofts, MD 05/15/20 [phone]

## 2020-12-14 ENCOUNTER — Emergency Department: Payer: Medicaid Other

## 2020-12-14 ENCOUNTER — Emergency Department
Admission: EM | Admit: 2020-12-14 | Discharge: 2020-12-14 | Disposition: A | Discharge status: Home / Self Care | Payer: Medicaid Other | Attending: Emergency Medicine | Admitting: Emergency Medicine

## 2020-12-14 ENCOUNTER — Encounter: Payer: Self-pay | Admitting: Medical Oncology

## 2020-12-14 ENCOUNTER — Other Ambulatory Visit: Payer: Self-pay

## 2020-12-14 DIAGNOSIS — D259 Leiomyoma of uterus, unspecified: Secondary | ICD-10-CM | POA: Insufficient documentation

## 2020-12-14 DIAGNOSIS — R102 Pelvic and perineal pain: Secondary | ICD-10-CM | POA: Insufficient documentation

## 2020-12-14 DIAGNOSIS — F17210 Nicotine dependence, cigarettes, uncomplicated: Secondary | ICD-10-CM | POA: Insufficient documentation

## 2020-12-14 LAB — CBC
HCT: 39.1 % (ref 36.0–46.0)
Hemoglobin: 13.4 g/dL (ref 12.0–15.0)
MCH: 29 pg (ref 26.0–34.0)
MCHC: 34.3 g/dL (ref 30.0–36.0)
MCV: 84.6 fL (ref 80.0–100.0)
Platelets: 210 10*3/uL (ref 150–400)
RBC: 4.62 MIL/uL (ref 3.87–5.11)
RDW: 13.1 % (ref 11.5–15.5)
WBC: 7.3 10*3/uL (ref 4.0–10.5)
nRBC: 0 % (ref 0.0–0.2)

## 2020-12-14 LAB — COMPREHENSIVE METABOLIC PANEL
ALT: 22 U/L (ref 0–44)
AST: 23 U/L (ref 15–41)
Albumin: 4.1 g/dL (ref 3.5–5.0)
Alkaline Phosphatase: 167 U/L — ABNORMAL HIGH (ref 38–126)
Anion gap: 5 (ref 5–15)
BUN: 6 mg/dL (ref 6–20)
CO2: 22 mmol/L (ref 22–32)
Calcium: 8.5 mg/dL — ABNORMAL LOW (ref 8.9–10.3)
Chloride: 113 mmol/L — ABNORMAL HIGH (ref 98–111)
Creatinine, Ser: 0.59 mg/dL (ref 0.44–1.00)
GFR, Estimated: >60 mL/min (ref >60)
Glucose, Bld: 104 mg/dL — ABNORMAL HIGH (ref 70–99)
Potassium: 3.8 mmol/L (ref 3.5–5.1)
Sodium: 140 mmol/L (ref 135–145)
Total Bilirubin: 0.6 mg/dL (ref 0.3–1.2)
Total Protein: 6.7 g/dL (ref 6.5–8.1)

## 2020-12-14 LAB — WET PREP, GENITAL
Clue Cells Wet Prep HPF POC: NONE SEEN
Sperm: NONE SEEN
Trich, Wet Prep: NONE SEEN
Yeast Wet Prep HPF POC: NONE SEEN

## 2020-12-14 LAB — POC URINE PREG, ED: Preg Test, Ur: NEGATIVE

## 2020-12-14 LAB — URINALYSIS, COMPLETE (UACMP) WITH MICROSCOPIC
Bacteria, UA: NONE SEEN
Bilirubin Urine: NEGATIVE
Glucose, UA: NEGATIVE mg/dL
Ketones, ur: NEGATIVE mg/dL
Leukocytes,Ua: NEGATIVE
Nitrite: NEGATIVE
Protein, ur: NEGATIVE mg/dL
Specific Gravity, Urine: 1.002 — ABNORMAL LOW (ref 1.005–1.030)
pH: 6 (ref 5.0–8.0)

## 2020-12-14 LAB — CHLAMYDIA/NGC RT PCR (ARMC ONLY)
Chlamydia Tr: NOT DETECTED
N gonorrhoeae: NOT DETECTED

## 2020-12-14 MED ORDER — IBUPROFEN 600 MG PO TABS
600.0000 mg | ORAL_TABLET | Freq: Once | ORAL | Status: AC
  Administered 2020-12-14: 600 mg via ORAL
  Filled 2020-12-14: qty 1

## 2020-12-14 MED ORDER — ACETAMINOPHEN 500 MG PO TABS
1000.0000 mg | ORAL_TABLET | Freq: Once | ORAL | Status: AC
  Administered 2020-12-14: 1000 mg via ORAL
  Filled 2020-12-14: qty 2

## 2020-12-14 MED ORDER — MEDROXYPROGESTERONE ACETATE 5 MG PO TABS: 5.0000 mg | ORAL_TABLET | Freq: Every day | ORAL | 0 refills | Status: AC

## 2020-12-14 NOTE — ED Notes (Signed)
Sig pad not working, pt verbalizes understanding of d/c instructions. Denies questions

## 2020-12-14 NOTE — ED Notes (Signed)
See triage note, pt reports heavy vaginal bleeding that started this am. States she "has not ate anything to cause this", Reports going through a pad, tampon, panties and pants in an hour NAD noted, skin color WDL

## 2020-12-14 NOTE — ED Triage Notes (Signed)
Pt reports she has been having really heavy vaginal bleeding since yesterday with some lower abd cramping.

## 2020-12-14 NOTE — ED Provider Notes (Signed)
[facility] Emergency Department Provider Note  ____________________________________________   Event Date/Time   First MD Initiated Contact with Patient 12/14/20 1247     (approximate)  I have reviewed the triage vital signs and the nursing notes.   HISTORY  Chief Complaint Vaginal Bleeding   HPI Tracy Holden is a 35 y.o. female with a past medical history of an appendectomy and tubal ligation and irregular menstrual periods who presents for assessment of some heavy vaginal bleeding.  Patient states she started her period yesterday and had some spotting initially but has been bleeding more than usual today thinking she is going through about 5 pads.  She states she has had some lower abdominal crampiness rating into her back.  She denies any upper abdominal pain, other back pain, burning with urination, chest pain, cough, shortness of breath, headache, earache, sore throat, dizziness, rash or recent injuries or falls.  She is on any blood thinners.  She is not taking any medicines for pain.  She is not on any birth control.         History reviewed. No pertinent past medical history.  Patient Active Problem List   Diagnosis Date Noted   Acute appendicitis, uncomplicated 81/[id number]/8185    Past Surgical History:  Procedure Laterality Date   LAPAROSCOPIC APPENDECTOMY N/A 07/13/2018   Procedure: APPENDECTOMY LAPAROSCOPIC;  Surgeon: Olean Ree, MD;  Location: [facility] ORS;  Service: General;  Laterality: N/A;   TUBAL LIGATION      Prior to Admission medications   Medication Sig Start Date End Date Taking? Authorizing Provider  medroxyPROGESTERone (PROVERA) 5 MG tablet Take 1 tablet (5 mg total) by mouth daily. 12/14/20 01/13/21 Yes Lucrezia Starch, MD  ibuprofen (ADVIL) 600 MG tablet Take 1 tablet (600 mg total) by mouth every 8 (eight) hours as needed for fever or mild pain. 06/14/19   Sable Feil, PA-C    Allergies Patient has no known allergies.  No  family history on file.  Social History Social History   Tobacco Use   Smoking status: Every Day    Packs/day: 0.50    Pack years: 0.00    Types: Cigarettes   Smokeless tobacco: Never  Substance Use Topics   Alcohol use: Yes    Alcohol/week: 3.0 standard drinks    Types: 3 Cans of beer per week    Review of Systems  Review of Systems  Constitutional:  Negative for chills and fever.  HENT:  Negative for sore throat.   Eyes:  Negative for pain.  Respiratory:  Negative for cough and stridor.   Cardiovascular:  Negative for chest pain.  Gastrointestinal:  Positive for abdominal pain (radiating towards back). Negative for vomiting.  Genitourinary:  Negative for dysuria.  Musculoskeletal:  Negative for myalgias.  Skin:  Negative for rash.  Neurological:  Negative for seizures, loss of consciousness and headaches.  Psychiatric/Behavioral:  Negative for suicidal ideas.   All other systems reviewed and are negative.    ____________________________________________   PHYSICAL EXAM:  VITAL SIGNS: ED Triage Vitals  Enc Vitals Group     BP 12/14/20 1207 (!) 152/82     Pulse Rate 12/14/20 1207 60     Resp 12/14/20 1207 18     Temp 12/14/20 1207 98 F (36.7 C)     Temp Source 12/14/20 1207 Oral     SpO2 12/14/20 1207 96 %     Weight 12/14/20 1208 134 lb (60.8 kg)     Height  12/14/20 1208 4\' 11"  (1.499 m)     Head Circumference --      Peak Flow --      Pain Score 12/14/20 1208 9     Pain Loc --      Pain Edu? --      Excl. in [place]? --    Vitals:   12/14/20 1533 12/14/20 1534  BP: (!) 144/86   Pulse: (!) 55 (!) 51  Resp: 20   Temp:    SpO2: 100% 100%   Physical Exam Vitals and nursing note reviewed.  Constitutional:      General: She is not in acute distress.    Appearance: She is well-developed.  HENT:     Head: Normocephalic and atraumatic.     Right Ear: External ear normal.     Left Ear: External ear normal.     Nose: Nose normal.  Eyes:      Conjunctiva/sclera: Conjunctivae normal.  Cardiovascular:     Rate and Rhythm: Normal rate and regular rhythm.     Heart sounds: No murmur heard. Pulmonary:     Effort: Pulmonary effort is normal. No respiratory distress.     Breath sounds: Normal breath sounds.  Abdominal:     Palpations: Abdomen is soft.     Tenderness: There is abdominal tenderness (mild supra pubic discomfort).  Musculoskeletal:     Cervical back: Neck supple.  Skin:    General: Skin is warm and dry.     Capillary Refill: Capillary refill takes less than 2 seconds.  Neurological:     Mental Status: She is alert and oriented to person, place, and time.  Psychiatric:        Mood and Affect: Mood normal.    GU exam shows some scant blood in the vaginal vault and a white spot that does not seem to scrape off on the lower cervix no other abnormal discharge or obvious source of bleeding. ____________________________________________   LABS (all labs ordered are listed, but only abnormal results are displayed)  Labs Reviewed  WET PREP, GENITAL - Abnormal; Notable for the following components:      Result Value   WBC, Wet Prep HPF POC FEW (*)    All other components within normal limits  COMPREHENSIVE METABOLIC PANEL - Abnormal; Notable for the following components:   Chloride 113 (*)    Glucose, Bld 104 (*)    Calcium 8.5 (*)    Alkaline Phosphatase 167 (*)    All other components within normal limits  URINALYSIS, COMPLETE (UACMP) WITH MICROSCOPIC - Abnormal; Notable for the following components:   Color, Urine STRAW (*)    APPearance CLEAR (*)    Specific Gravity, Urine 1.002 (*)    Hgb urine dipstick MODERATE (*)    All other components within normal limits  CHLAMYDIA/NGC RT PCR ([facility] ONLY)            CBC  POC URINE PREG, ED   ____________________________________________  EKG   ____________________________________________  RADIOLOGY  ED MD interpretation: Pelvic ultrasound remarkable for multiple  uterine leiomyomata with a possible polyp.  No other clear acute process.  Official radiology report(s): US PELVIC COMPLETE W TRANSVAGINAL AND TORSION R/O  Result Date: 12/14/2020 CLINICAL DATA:  Heavy bleeding with menses, pelvic pain, LMP 12/13/2020, G2P2 EXAM: TRANSABDOMINAL AND TRANSVAGINAL ULTRASOUND OF PELVIS DOPPLER ULTRASOUND OF OVARIES TECHNIQUE: Both transabdominal and transvaginal ultrasound examinations of the pelvis were performed. Transabdominal technique was performed for global imaging of the pelvis including  uterus, ovaries, adnexal regions, and pelvic cul-de-sac. It was necessary to proceed with endovaginal exam following the transabdominal exam to visualize the endometrium and ovaries. Color and duplex Doppler ultrasound was utilized to evaluate blood flow to the ovaries. COMPARISON:  11/11/2013 FINDINGS: Uterus Measurements: 8.4 x 5.3 x 5.6 cm = volume: 130 mL. Multiple uterine leiomyomata, largest anterior wall intramural 2.9 x 2.3 x 2.4 cm. Smaller RIGHT fundal leiomyoma 2.0 cm and posterior RIGHT lateral leiomyoma 1.3 cm identified. Endometrium Thickness: 8 mm. Suboptimally visualized. Mass identified at upper uterine segment endometrial canal, 12 x 11 x 10 mm, question endometrial polyp versus submucosal leiomyoma. Right ovary Measurements: 4.1 x 3.2 x 4.4 cm = volume: 30 mL. Simple cyst RIGHT ovary 3.0 cm diameter; no follow-up imaging recommended. Left ovary Measurements: 2.7 x 1.2 x 1.4 cm = volume: 3 mL. Normal morphology without mass Pulsed Doppler evaluation of both ovaries demonstrates low resistance arterial and venous waveforms bilaterally. Other findings No free pelvic fluid or adnexal masses. IMPRESSION: Multiple uterine leiomyomata. 12 mm submucosal leiomyoma versus endometrial polyp at upper uterine segment endometrial canal; consider further evaluation with sonohysterogram for confirmation prior to hysteroscopy. Endometrial sampling should also be considered if patient is  at high risk for endometrial carcinoma. (Ref: Radiological Reasoning: Algorithmic Workup of Abnormal Vaginal Bleeding with Endovaginal Sonography and Sonohysterography. AJR 2008; 875:I43-32) . Electronically Signed   By: Lavonia Dana M.D.   On: 12/14/2020 15:41    ____________________________________________   PROCEDURES  Procedure(s) performed (including Critical Care):  Procedures   ____________________________________________   INITIAL IMPRESSION / ASSESSMENT AND PLAN / ED COURSE      Patient presents with above to history exam for assessment of some heavier than normal menstrual bleeding in the setting of irregular periods and some lower abdominal crampiness rating to her back.  On arrival she is hypertensive with BP of 152/82 with otherwise stable vital signs on room air.  Differential uncomplicated abnormal uterine bleeding possibly related to a polyp, hormonal changes, coagulopathy, fibroids or adenomyomatosis.  She is not anticoagulated.  Exam shows no findings clear consistent with PID.  Advised her was not sure what the white spot was on the cervix that cannot scrape off, advised her cannot rule out malignancy and this should be followed up with GYN soon as she is able to get Pap smear done.  Wet prep shows no evidence of bacterial vaginosis, candidiasis or trichomoniasis.  GC PCR is negative.  Pregnancy test is negative.  BC shows no leukocytosis or acute anemia.  CMP shows no significant electrolyte or metabolic derangements.  Pelvic ultrasound remarkable for multiple uterine leiomyomata with a possible polyp.  No other clear acute process.  No evidence of torsion or abscess.  Overall I suspect patient did not uterine bleeding secondary to these leiomyomata.  We will start her on progesterone only OCPs and advised Tylenol and ibuprofen.  We will have her follow-up with [facility] for further evaluation and management.  Discharged stable condition.  Strict return cautions advised  discussed including specific instructions regarding development of any new symptoms i.e. chest pain, shortness of breath, dizziness, headache, or any change in her bleeding.     ____________________________________________   FINAL CLINICAL IMPRESSION(S) / ED DIAGNOSES  Final diagnoses:  Pelvic pain  Uterine leiomyoma, unspecified location    Medications  acetaminophen (TYLENOL) tablet 1,000 mg (has no administration in time range)  ibuprofen (ADVIL) tablet 600 mg (has no administration in time range)     ED Discharge Orders  Ordered    medroxyPROGESTERone (PROVERA) 5 MG tablet  Daily        12/14/20 1550             Note:  This document was prepared using Dragon voice recognition software and may include unintentional dictation errors.    Lucrezia Starch, MD 12/14/20 [phone]

## 2020-12-14 NOTE — Discharge Instructions (Addendum)
Your ultrasound today showed: Multiple uterine leiomyomata.   12 mm submucosal leiomyoma versus endometrial polyp at upper uterine segment endometrial canal; consider further evaluation with sonohysterogram for confirmation prior to hysteroscopy. Endometrial sampling should also be considered if patient is at high risk for endometrial carcinoma. (Ref: Radiological Reasoning: Algorithmic Workup of Abnormal Vaginal Bleeding with Endovaginal Sonography and Sonohysterography. AJR 2008; 470:R61-51) .

## 2021-02-17 ENCOUNTER — Other Ambulatory Visit: Payer: Self-pay

## 2021-02-17 ENCOUNTER — Emergency Department
Admission: EM | Admit: 2021-02-17 | Discharge: 2021-02-17 | Disposition: A | Discharge status: Home / Self Care | Payer: Medicaid Other | Attending: Emergency Medicine | Admitting: Emergency Medicine

## 2021-02-17 ENCOUNTER — Encounter: Payer: Self-pay | Admitting: Emergency Medicine

## 2021-02-17 DIAGNOSIS — M5431 Sciatica, right side: Secondary | ICD-10-CM

## 2021-02-17 DIAGNOSIS — F17210 Nicotine dependence, cigarettes, uncomplicated: Secondary | ICD-10-CM | POA: Insufficient documentation

## 2021-02-17 DIAGNOSIS — M5441 Lumbago with sciatica, right side: Secondary | ICD-10-CM | POA: Insufficient documentation

## 2021-02-17 MED ORDER — PREDNISONE 10 MG (21) PO TBPK: ORAL_TABLET | ORAL | 0 refills | Status: DC

## 2021-02-17 MED ORDER — HYDROCODONE-ACETAMINOPHEN 5-325 MG PO TABS
1.0000 | ORAL_TABLET | Freq: Once | ORAL | Status: AC
  Administered 2021-02-17: 1 via ORAL
  Filled 2021-02-17: qty 1

## 2021-02-17 MED ORDER — ORPHENADRINE CITRATE 30 MG/ML IJ SOLN
60.0000 mg | Freq: Two times a day (BID) | INTRAMUSCULAR | Status: DC
  Administered 2021-02-17: 60 mg via INTRAMUSCULAR
  Filled 2021-02-17: qty 2

## 2021-02-17 MED ORDER — TIZANIDINE HCL 4 MG PO TABS: 4.0000 mg | ORAL_TABLET | Freq: Three times a day (TID) | ORAL | 0 refills | Status: AC

## 2021-02-17 MED ORDER — HYDROCODONE-ACETAMINOPHEN 5-325 MG PO TABS: 1.0000 | ORAL_TABLET | Freq: Four times a day (QID) | ORAL | 0 refills | Status: AC | PRN

## 2021-02-17 MED ORDER — KETOROLAC TROMETHAMINE 30 MG/ML IJ SOLN
30.0000 mg | Freq: Once | INTRAMUSCULAR | Status: AC
  Administered 2021-02-17: 30 mg via INTRAVENOUS
  Filled 2021-02-17: qty 1

## 2021-02-17 NOTE — ED Triage Notes (Signed)
Pt comes into the ED via POV c/o right low back pain that radiates through the groin and into the leg.  Pt states it feels like a sharp pain.  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  Pt ambulatory to triage at this time and in NAD. Denies any urinary problems or bowel problems.

## 2021-02-17 NOTE — ED Provider Notes (Signed)
[facility] Emergency Department Provider Note ____________________________________________  Time seen: Approximately 11:24 AM  I have reviewed the triage vital signs and the nursing notes.  HISTORY  Chief Complaint Back Pain and Leg Pain   HPI Tracy Holden is a 35 y.o. female who presents to the emergency department for treatment and evaluation of right side back pain that started 4 days ago.  No specific injury.  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  Pain is now in the right lower back and radiates down her right leg.  She denies any bowel or bladder changes or incontinence.  She is able to walk but has pain in the right lower extremity.  No relief with Tylenol at home.  History reviewed. No pertinent past medical history.  Patient Active Problem List   Diagnosis Date Noted   Acute appendicitis, uncomplicated [id number]    Past Surgical History:  Procedure Laterality Date   LAPAROSCOPIC APPENDECTOMY N/A 07/13/2018   Procedure: APPENDECTOMY LAPAROSCOPIC;  Surgeon: Olean Ree, MD;  Location: [facility] ORS;  Service: General;  Laterality: N/A;   TUBAL LIGATION      Prior to Admission medications   Medication Sig Start Date End Date Taking? Authorizing Provider  HYDROcodone-acetaminophen (NORCO/VICODIN) 5-325 MG tablet Take 1 tablet by mouth every 6 (six) hours as needed for up to 3 days for severe pain. 02/17/21 02/20/21 Yes Manish Ruggiero B, FNP  predniSONE (STERAPRED UNI-PAK 21 TAB) 10 MG (21) TBPK tablet Take 6 tablets on the first day and decrease by 1 tablet each day until finished. 02/17/21  Yes Janaysia Mcleroy B, FNP  tiZANidine (ZANAFLEX) 4 MG tablet Take 1 tablet (4 mg total) by mouth 3 (three) times daily. 02/17/21  Yes Kalayna Noy B, FNP  ibuprofen (ADVIL) 600 MG tablet Take 1 tablet (600 mg total) by mouth every 8 (eight) hours as needed for fever or mild pain. 06/14/19   Sable Feil, PA-C   medroxyPROGESTERone (PROVERA) 5 MG tablet Take 1 tablet (5 mg total) by mouth daily. 12/14/20 01/13/21  Lucrezia Starch, MD    Allergies Patient has no known allergies.  History reviewed. No pertinent family history.  Social History Social History   Tobacco Use   Smoking status: Every Day    Packs/day: 0.50    Types: Cigarettes   Smokeless tobacco: Never  Substance Use Topics   Alcohol use: Yes    Alcohol/week: 3.0 standard drinks    Types: 3 Cans of beer per week    Review of Systems Constitutional: Well appearing. Respiratory: Negative for dyspnea. Cardiovascular: Negative for change in skin temperature or color. Musculoskeletal:   Negative for chronic steroid use   Negative for trauma in the presence of osteoporosis  Negative negative for age over 75 and trauma.  Negative for constitutional symptoms, or history of cancer   Negative for pain worse at night. Skin: Negative for rash, lesion, or wound.  Genitourinary: Negative for urinary retention. Rectal: Negative for fecal incontinence or new onset constipation/bowel habit changes. Hematological/Immunilogical: Negative for immunosuppression, IV drug use, or fever Neurological: Positive for burning, tingling, numb, electric, radiating pain in the right lower extremity.                        Negative for saddle anesthesia.  Negative for focal neurologic deficit, progressive or disabling symptoms             Negative for saddle anesthesia. ____________________________________________   PHYSICAL EXAM:  VITAL SIGNS: ED Triage Vitals  Enc Vitals Group     BP 02/17/21 1055 (!) 130/103     Pulse Rate 02/17/21 1055 71     Resp 02/17/21 1055 19     Temp 02/17/21 1055 98.5 F (36.9 C)     Temp Source 02/17/21 1055 Oral     SpO2 02/17/21 1055 100 %     Weight 02/17/21 1059 134 lb 0.6 oz (60.8 kg)     Height 02/17/21 1059 '4\' 11"'$  (1.499 m)     Head Circumference --      Peak Flow --      Pain  Score 02/17/21 1059 8     Pain Loc --      Pain Edu? --      Excl. in [place]? --     Constitutional: Alert and oriented. Well appearing and in no acute distress. Eyes: Conjunctivae are clear without discharge or drainage.  Head: Atraumatic. Neck: Full, active range of motion. Respiratory: Respirations even and unlabored. Musculoskeletal: Limited ROM of the back and extremities, Strength 5/5 of the lower extremities as tested. Neurologic: Reflexes of the lower extremities are 2+.  Positive straight leg raise on the right side. Skin: Atraumatic.  Psychiatric: Behavior and affect are normal.  ____________________________________________   LABS (all labs ordered are listed, but only abnormal results are displayed)  Labs Reviewed - No data to display ____________________________________________  RADIOLOGY  Not indicated. ____________________________________________   PROCEDURES  Procedure(s) performed:  Procedures ____________________________________________   INITIAL IMPRESSION / ASSESSMENT AND PLAN / ED COURSE  Tracy Holden is a 35 y.o. female who presents to the emergency department for treatment and evaluation of right side lower back pain that radiates into her right lower extremity.   While here, medications given as listed below with significant relief.  She will be placed on prednisone, Zanaflex, and Norco.  She is to call and follow-up with her primary care provider if she is not feeling better over the week.  She is to return to the emergency department for symptoms of change or worsen if she is unable to get an appointment.  Medications  orphenadrine (NORFLEX) injection 60 mg (60 mg Intramuscular Given 02/17/21 1153)  ketorolac (TORADOL) 30 MG/ML injection 30 mg (30 mg Intravenous Given 02/17/21 1152)  HYDROcodone-acetaminophen (NORCO/VICODIN) 5-325 MG per tablet 1 tablet (1 tablet Oral Given 02/17/21 1153)    ED Discharge Orders          Ordered    predniSONE  (STERAPRED UNI-PAK 21 TAB) 10 MG (21) TBPK tablet        02/17/21 1232    tiZANidine (ZANAFLEX) 4 MG tablet  3 times daily        02/17/21 1232    HYDROcodone-acetaminophen (NORCO/VICODIN) 5-325 MG tablet  Every 6 hours PRN        02/17/21 1232               Pertinent labs & imaging results that were available during my care of the patient were reviewed by me and considered in my medical decision making (see chart for details).   _________________________________________   FINAL CLINICAL IMPRESSION(S) / ED DIAGNOSES   Final diagnoses:  Sciatica of right side     If controlled substance prescribed during this visit, 12 month history viewed  on the [place] prior to issuing an initial prescription for Schedule II or III opiod.    Victorino Dike, FNP 02/17/21 1535    Naaman Plummer, MD 02/17/21 [phone]

## 2021-02-17 NOTE — ED Notes (Signed)
See triage note  presents with lower back pain which is moving into right  leg  states she does some heavy lifting   denies any fall

## 2021-08-16 ENCOUNTER — Encounter: Payer: Self-pay | Admitting: *Deleted

## 2021-08-16 ENCOUNTER — Emergency Department: Payer: Self-pay

## 2021-08-16 ENCOUNTER — Emergency Department
Admission: EM | Admit: 2021-08-16 | Discharge: 2021-08-16 | Disposition: A | Discharge status: Home / Self Care | Payer: Self-pay | Attending: Emergency Medicine | Admitting: Emergency Medicine

## 2021-08-16 ENCOUNTER — Other Ambulatory Visit: Payer: Self-pay

## 2021-08-16 DIAGNOSIS — M5442 Lumbago with sciatica, left side: Secondary | ICD-10-CM | POA: Insufficient documentation

## 2021-08-16 DIAGNOSIS — M5432 Sciatica, left side: Secondary | ICD-10-CM

## 2021-08-16 LAB — POC URINE PREG, ED: Preg Test, Ur: NEGATIVE

## 2021-08-16 MED ORDER — CYCLOBENZAPRINE HCL 10 MG PO TABS
10.0000 mg | ORAL_TABLET | Freq: Three times a day (TID) | ORAL | 0 refills | Status: AC | PRN
Start: 2021-08-16 — End: 2021-08-21

## 2021-08-16 MED ORDER — KETOROLAC TROMETHAMINE 15 MG/ML IJ SOLN
15.0000 mg | Freq: Once | INTRAMUSCULAR | Status: AC
  Administered 2021-08-16: 15 mg via INTRAVENOUS
  Filled 2021-08-16: qty 1

## 2021-08-16 MED ORDER — DEXAMETHASONE SODIUM PHOSPHATE 10 MG/ML IJ SOLN
10.0000 mg | Freq: Once | INTRAMUSCULAR | Status: AC
  Administered 2021-08-16: 10 mg via INTRAMUSCULAR
  Filled 2021-08-16: qty 1

## 2021-08-16 NOTE — ED Triage Notes (Signed)
Pt has pain in left lower back, no known injury.  No urinary sx.  Pain radiates into left leg.  Pt alert ?

## 2021-08-16 NOTE — Discharge Instructions (Addendum)
-  Treat pain with Tylenol/ibuprofen as needed.  Use cyclobenzaprine as needed. ?-Return to the emergency department anytime if you begin to experience any new or worsening symptoms. ?-Follow-up with your primary care provider as needed. ?

## 2021-08-16 NOTE — ED Notes (Signed)
Pt verbalized understanding of discharge.

## 2021-08-16 NOTE — ED Provider Notes (Signed)
? ?[facility] ?Provider Note ? ? ? Event Date/Time  ? First MD Initiated Contact with Patient 08/16/21 1708   ?  (approximate) ? ? ?History  ? ?Chief Complaint ?Back Pain ? ? ?HPI ?Tracy Holden is a 36 y.o. female, no remarkable medical history, presents to the emergency department for evaluation of low back pain.  Patient states that she has historically had some sciatica type pain on the right side, however over the past few days, she has had midline lumbar pain as well as radiation into the left lower extremity.  She states that she works in a [employer], but is not had any significant lifts or injuries recently.  Denies fever/chills, saddle anesthesia, bowel/bladder dysfunction, nausea/vomiting, headache, abdominal pain, chest pain, or shortness of breath. ? ?History Limitations: No limitations ? ?  ? ? ?Physical Exam  ?Triage Vital Signs: ?ED Triage Vitals  ?Enc Vitals Group  ?   BP 08/16/21 1703 126/85  ?   Pulse Rate 08/16/21 1703 65  ?   Resp 08/16/21 1703 18  ?   Temp 08/16/21 1703 98.6 ?F (37 ?C)  ?   Temp Source 08/16/21 1703 Oral  ?   SpO2 08/16/21 1703 99 %  ?   Weight 08/16/21 1702 133 lb (60.3 kg)  ?   Height 08/16/21 1702 '4\' 11"'$  (1.499 m)  ?   Head Circumference --   ?   Peak Flow --   ?   Pain Score 08/16/21 1701 7  ?   Pain Loc --   ?   Pain Edu? --   ?   Excl. in [place]? --   ? ? ?Most recent vital signs: ?Vitals:  ? 08/16/21 1703 08/16/21 2112  ?BP: 126/85 133/88  ?Pulse: 65 62  ?Resp: 18 16  ?Temp: 98.6 ?F (37 ?C)   ?SpO2: 99% 99%  ? ? ?General: Awake, NAD.  ?CV: Good peripheral perfusion.  ?Resp: Normal effort.  ?Abd: Soft, non-tender. No distention.  ?Neuro: At baseline. No gross neurological deficits.  ?Other: Patient endorses mild midline lumbar tenderness around the L4/L5 region.  Pulse, motor, sensation intact distally.  Endorses pain with straight leg test on the left side.  Patient is able to ambulate well across the room without assistance. ? ?Physical Exam ? ? ? ?ED  Results / Procedures / Treatments  ?Labs ?(all labs ordered are listed, but only abnormal results are displayed) ?Labs Reviewed  ?POC URINE PREG, ED  ? ? ? ?EKG ?Not applicable. ? ? ?RADIOLOGY ? ?ED Provider Interpretation: I personally reviewed this x-ray, no evidence of acute findings based on my interpretation ? ?DG Lumbar Spine Complete ? ?Result Date: 08/16/2021 ?CLINICAL DATA:  Midline low back pain EXAM: LUMBAR SPINE - COMPLETE 4+ VIEW COMPARISON:  None. FINDINGS: There is no evidence of lumbar spine fracture. Alignment is normal. Intervertebral disc spaces are maintained. IMPRESSION: Negative. Electronically Signed   By: Rolm Baptise M.D.   On: 08/16/2021 20:21   ? ?PROCEDURES: ? ?Critical Care performed: Not applicable ? ?Procedures ? ? ? ?MEDICATIONS ORDERED IN ED: ?Medications  ?ketorolac (TORADOL) 15 MG/ML injection 15 mg (15 mg Intravenous Given 08/16/21 1826)  ?dexamethasone (DECADRON) injection 10 mg (10 mg Intramuscular Given 08/16/21 1826)  ? ? ? ?IMPRESSION / MDM / ASSESSMENT AND PLAN / ED COURSE  ?I reviewed the triage vital signs and the nursing notes. ?             ?               ? ? ?  Differential diagnosis includes, but is not limited to, lumbosacral strain, lumbar radiculopathy, vertebral fracture, discitis, ? ?ED Course ?Patient appears well.  Vital signs within normal limits.  She is currently endorsing significant pain.  We will go ahead treat with IM ketorolac and dexamethasone ? ?X-ray shows no evidence of acute findings ? ?Assessment/Plan ?Presentation consistent with sciatica.  No acute findings on x-ray.  Low suspicion for occult injuries requiring advanced imaging given her stable presentation. Treated here with ketorolac and dexamethasone with mild improvement.  We will plan to discharge this patient with cyclobenzaprine.  Encouraged her to follow-up with her primary care provider as needed.  We will discharge this patient. ? ?Patient was provided with anticipatory guidance, return  precautions, and educational material. Encouraged the patient to return to the emergency department at any time if they begin to experience any new or worsening symptoms.  ? ?  ? ? ?FINAL CLINICAL IMPRESSION(S) / ED DIAGNOSES  ? ?Final diagnoses:  ?Sciatica of left side  ?Acute left-sided low back pain with left-sided sciatica  ? ? ? ?Rx / DC Orders  ? ?ED Discharge Orders   ? ?      Ordered  ?  cyclobenzaprine (FLEXERIL) 10 MG tablet  3 times daily PRN       ? 08/16/21 2102  ? ?  ?  ? ?  ? ? ? ?Note:  This document was prepared using Dragon voice recognition software and may include unintentional dictation errors. ?  ?Teodoro Spray, [place] ?08/16/21 2320 ? ?  ?Harvest Dark, MD ?08/16/21 2336 ? ?

## 2022-03-15 ENCOUNTER — Other Ambulatory Visit: Payer: Self-pay

## 2022-03-15 ENCOUNTER — Emergency Department
Admission: EM | Admit: 2022-03-15 | Discharge: 2022-03-15 | Disposition: A | Discharge status: Home / Self Care | Payer: 59 | Attending: Emergency Medicine | Admitting: Emergency Medicine

## 2022-03-15 ENCOUNTER — Emergency Department: Payer: 59

## 2022-03-15 DIAGNOSIS — W010XXA Fall on same level from slipping, tripping and stumbling without subsequent striking against object, initial encounter: Secondary | ICD-10-CM | POA: Insufficient documentation

## 2022-03-15 DIAGNOSIS — M5431 Sciatica, right side: Secondary | ICD-10-CM | POA: Diagnosis not present

## 2022-03-15 DIAGNOSIS — Y990 Civilian activity done for income or pay: Secondary | ICD-10-CM | POA: Insufficient documentation

## 2022-03-15 DIAGNOSIS — S3992XA Unspecified injury of lower back, initial encounter: Secondary | ICD-10-CM | POA: Diagnosis present

## 2022-03-15 DIAGNOSIS — S39012A Strain of muscle, fascia and tendon of lower back, initial encounter: Secondary | ICD-10-CM | POA: Diagnosis not present

## 2022-03-15 LAB — URINALYSIS, ROUTINE W REFLEX MICROSCOPIC
Bilirubin Urine: NEGATIVE
Glucose, UA: NEGATIVE mg/dL
Hgb urine dipstick: NEGATIVE
Ketones, ur: NEGATIVE mg/dL
Leukocytes,Ua: NEGATIVE
Nitrite: NEGATIVE
Protein, ur: NEGATIVE mg/dL
Specific Gravity, Urine: 1.009 (ref 1.005–1.030)
pH: 5 (ref 5.0–8.0)

## 2022-03-15 LAB — POC URINE PREG, ED: Preg Test, Ur: NEGATIVE

## 2022-03-15 MED ORDER — CYCLOBENZAPRINE HCL 5 MG PO TABS: 5.0000 mg | ORAL_TABLET | Freq: Three times a day (TID) | ORAL | 0 refills | Status: AC | PRN

## 2022-03-15 MED ORDER — LIDOCAINE 5 % EX PTCH: 1.0000 | MEDICATED_PATCH | Freq: Two times a day (BID) | CUTANEOUS | 0 refills | Status: AC | PRN

## 2022-03-15 MED ORDER — CYCLOBENZAPRINE HCL 10 MG PO TABS
10.0000 mg | ORAL_TABLET | Freq: Once | ORAL | Status: AC
  Administered 2022-03-15: 10 mg via ORAL
  Filled 2022-03-15: qty 1

## 2022-03-15 MED ORDER — NABUMETONE 750 MG PO TABS: 750.0000 mg | ORAL_TABLET | Freq: Two times a day (BID) | ORAL | 0 refills | Status: AC

## 2022-03-15 MED ORDER — KETOROLAC TROMETHAMINE 30 MG/ML IJ SOLN
30.0000 mg | Freq: Once | INTRAMUSCULAR | Status: AC
  Administered 2022-03-15: 30 mg via INTRAMUSCULAR
  Filled 2022-03-15: qty 1

## 2022-03-15 MED ORDER — LIDOCAINE 5 % EX PTCH
1.0000 | MEDICATED_PATCH | Freq: Once | CUTANEOUS | Status: DC
  Administered 2022-03-15: 1 via TRANSDERMAL
  Filled 2022-03-15: qty 1

## 2022-03-15 NOTE — Discharge Instructions (Signed)
Your exam and XR are normal and reassuring. You do not have any evidence of a fracture or degenerative disc. Take the prescriptions as directed. Follow-up with your provider as needed.

## 2022-03-15 NOTE — ED Notes (Signed)
Patient discharged to home per MD order. Patient in stable condition, and deemed medically cleared by ED provider for discharge. Discharge instructions reviewed with patient/family using "Teach Back"; verbalized understanding of medication education and administration, and information about follow-up care. Denies further concerns. ° °

## 2022-03-15 NOTE — ED Provider Triage Note (Signed)
Emergency Medicine Provider Triage Evaluation Note  Tracy Holden , a 36 y.o. female  was evaluated in triage.  Pt complains of right leg radiculopathy. Has been doing exercising but no injury.    Review of Systems  Positive: Low back pain Negative: No urinary sx.  Physical Exam  There were no vitals taken for this visit. Gen:   Awake, no distress   Resp:  Normal effort  MSK:   Moves extremities without difficulty, walks with limp. Other:    Medical Decision Making  Medically screening exam initiated at 12:37 PM.  Appropriate orders placed.  Makaiah Terwilliger was informed that the remainder of the evaluation will be completed by another provider, this initial triage assessment does not replace that evaluation, and the importance of remaining in the ED until their evaluation is complete.     Johnn Hai, PA-C 03/15/22 1239

## 2022-03-15 NOTE — ED Triage Notes (Addendum)
Pt to ED for lower back pain and R leg pain that starts at lower back. Has been doing cat cow exercises to help with back pain but not helping. Pain is shooting down leg since about 1 week ago and is always on feet at work. Slipped and fell onto back at work about 2 weeks ago.

## 2022-03-15 NOTE — ED Provider Notes (Signed)
[facility] Emergency Department Provider Note     Event Date/Time   First MD Initiated Contact with Patient 03/15/22 1402     (approximate)   History   Back Pain and Leg Pain   HPI  Tracy Holden is a 36 y.o. female presents to the ED for evaluation of 2 weeks of persistent low back pain with referral to the right lower extremity.  Patient reports onset of symptoms proximately 2 weeks ago when she had a fall at work.  She described falling onto her buttock at that time. Over the last week she experiencing right lower extremity pain describing the discomfort as shooting in nature.  She has been doing low back stretching exercises with limited benefit.  She denies any bladder or bowel incontinence, foot drop, saddle anesthesia.     Physical Exam   Triage Vital Signs: ED Triage Vitals  Enc Vitals Group     BP 03/15/22 1237 (!) 126/95     Pulse Rate 03/15/22 1237 65     Resp 03/15/22 1237 14     Temp 03/15/22 1237 97.7 F (36.5 C)     Temp Source 03/15/22 1237 Oral     SpO2 03/15/22 1237 96 %     Weight 03/15/22 1239 169 lb (76.7 kg)     Height 03/15/22 1239 5' (1.524 m)     Head Circumference --      Peak Flow --      Pain Score 03/15/22 1238 8     Pain Loc --      Pain Edu? --      Excl. in [place]? --     Most recent vital signs: Vitals:   03/15/22 1237  BP: (!) 126/95  Pulse: 65  Resp: 14  Temp: 97.7 F (36.5 C)  SpO2: 96%    General Awake, no distress. NAD CV:  Good peripheral perfusion.  RESP:  Normal effort.  ABD:  No distention.  MSK:  Normal spinal alignment without midline tenderness, spasm, or deformity. Tender to palp over the right SI joint region. Normal ROM of the LE bilaterally. Decreased lumbar flexion and extension secondary to pain and tightness in the right LS region. NEURO: CN II-XII grossly intact.     ED Results / Procedures / Treatments   Labs (all labs ordered are listed, but only abnormal results are  displayed) Labs Reviewed  URINALYSIS, ROUTINE W REFLEX MICROSCOPIC - Abnormal; Notable for the following components:      Result Value   Color, Urine STRAW (*)    APPearance CLEAR (*)    All other components within normal limits  POC URINE PREG, ED     EKG    RADIOLOGY  I personally viewed and evaluated these images as part of my medical decision making, as well as reviewing the written report by the radiologist.  ED Provider Interpretation: no acute findings  DG Lumbar Spine Complete  Result Date: 03/15/2022 CLINICAL DATA:  Back pain, fall 2 weeks earlier EXAM: LUMBAR SPINE - COMPLETE 4+ VIEW COMPARISON:  08/16/2021 FINDINGS: No recent fracture is seen. Alignment of posterior margins of vertebral bodies appears normal. No significant disc space narrowing is seen. No interval changes are noted. IMPRESSION: No radiographic abnormalities are seen in lumbar spine. Electronically Signed   By: Elmer Picker M.D.   On: 03/15/2022 14:47     PROCEDURES:  Critical Care performed: No  Procedures   MEDICATIONS ORDERED IN ED: Medications  lidocaine (  LIDODERM) 5 % 1 patch (1 patch Transdermal Patch Applied 03/15/22 1503)  ketorolac (TORADOL) 30 MG/ML injection 30 mg (30 mg Intramuscular Given 03/15/22 1503)  cyclobenzaprine (FLEXERIL) tablet 10 mg (10 mg Oral Given 03/15/22 1503)     IMPRESSION / MDM / ASSESSMENT AND PLAN / ED COURSE  I reviewed the triage vital signs and the nursing notes.                              Differential diagnosis includes, but is not limited to, lumbar strain, lumbar contusion, lumbar radiculopathy, lumbar fracture, UTI  Patient's presentation is most consistent with acute complicated illness / injury requiring diagnostic workup.  Patient to the ED for evaluation of LBP with RLE referral after a mechanical fall onto the buttocks. She presents in no acute distress and without concern for cord compression syndrome or cauda equina syndrome.  Patient's diagnosis is consistent with sciatic irritation due to fall and back contusion. Her XRs are without acute fracture based on my interpretation. Patient will be discharged home with prescriptions for Flexeril, Relafen, and Lidoderm patches. Patient is to follow up with her PCP as needed or otherwise directed. Patient is given ED precautions to return to the ED for any worsening or new symptoms.     FINAL CLINICAL IMPRESSION(S) / ED DIAGNOSES   Final diagnoses:  Strain of lumbar region, initial encounter  Sciatica of right side     Rx / DC Orders   ED Discharge Orders          Ordered    cyclobenzaprine (FLEXERIL) 5 MG tablet  3 times daily PRN        03/15/22 1531    lidocaine (LIDODERM) 5 %  Every 12 hours PRN        03/15/22 1531    nabumetone (RELAFEN) 750 MG tablet  2 times daily        03/15/22 1531             Note:  This document was prepared using Dragon voice recognition software and may include unintentional dictation errors.    Melvenia Needles, PA-C 03/15/22 1755    Lavonia Drafts, MD 03/20/22 [phone]

## 2022-07-31 ENCOUNTER — Other Ambulatory Visit: Payer: Self-pay

## 2022-07-31 ENCOUNTER — Emergency Department
Admission: EM | Admit: 2022-07-31 | Discharge: 2022-07-31 | Disposition: A | Discharge status: Home / Self Care | Payer: 59 | Attending: Emergency Medicine | Admitting: Emergency Medicine

## 2022-07-31 ENCOUNTER — Encounter: Payer: Self-pay | Admitting: *Deleted

## 2022-07-31 DIAGNOSIS — R21 Rash and other nonspecific skin eruption: Secondary | ICD-10-CM | POA: Insufficient documentation

## 2022-07-31 MED ORDER — DIPHENHYDRAMINE HCL 25 MG PO CAPS
50.0000 mg | ORAL_CAPSULE | Freq: Once | ORAL | Status: AC
Start: 2022-07-31 — End: 2022-07-31
  Administered 2022-07-31: 50 mg via ORAL
  Filled 2022-07-31: qty 2

## 2022-07-31 MED ORDER — PREDNISONE 20 MG PO TABS: 40.0000 mg | ORAL_TABLET | Freq: Every day | ORAL | 0 refills | Status: AC

## 2022-07-31 MED ORDER — DIPHENHYDRAMINE HCL 25 MG PO CAPS: 25.0000 mg | ORAL_CAPSULE | Freq: Four times a day (QID) | ORAL | 0 refills | Status: AC | PRN

## 2022-07-31 MED ORDER — PREDNISONE 20 MG PO TABS
60.0000 mg | ORAL_TABLET | Freq: Once | ORAL | Status: AC
  Administered 2022-07-31: 60 mg via ORAL
  Filled 2022-07-31: qty 3

## 2022-07-31 MED ORDER — HYDROCORTISONE 0.5 % EX CREA: 1.0000 | TOPICAL_CREAM | Freq: Two times a day (BID) | CUTANEOUS | 0 refills | Status: AC | PRN

## 2022-07-31 NOTE — ED Provider Notes (Signed)
[facility] Provider Note    Event Date/Time   First MD Initiated Contact with Patient 07/31/22 2107     (approximate)   History   Rash   HPI  The patient speaks fluent English and declines interpreter services  Tracy Holden is a 37 y.o. female with a history of GERD who presents with a rash that started acutely today.  It is burning and itchy and started as some spots on her lower legs and then spread to her arms and torso.  She denies any tightness in her throat, shortness of breath, wheezing, or any fever or chills.  She has no rash or lesions inside her mouth.  She denies any specific allergic exposures although did state that she had her hair dyed yesterday.  However she has not ever had an allergic reaction to this previously.  I reviewed the past medical records.  The patient was most of this is seen in the ED in October of last year for back pain.  Her most recent outpatient encounter was with otolaryngology and speech therapy on 10/3 of last year for dysphonia.   Physical Exam   Triage Vital Signs: ED Triage Vitals  Enc Vitals Group     BP 07/31/22 2100 (!) 143/85     Pulse Rate 07/31/22 2100 70     Resp 07/31/22 2100 18     Temp 07/31/22 2100 98.3 F (36.8 C)     Temp Source 07/31/22 2100 Oral     SpO2 07/31/22 2100 99 %     Weight 07/31/22 2058 173 lb (78.5 kg)     Height 07/31/22 2058 5' (1.524 m)     Head Circumference --      Peak Flow --      Pain Score 07/31/22 2058 8     Pain Loc --      Pain Edu? --      Excl. in [place]? --     Most recent vital signs: Vitals:   07/31/22 2100  BP: (!) 143/85  Pulse: 70  Resp: 18  Temp: 98.3 F (36.8 C)  SpO2: 99%     General: Awake, no distress.  CV:  Good peripheral perfusion.  Resp:  Normal effort.  Abd:  No distention.  Other:  Scattered papular/urticarial rash to arms, legs, and torso.  No mucosal lesions.  No palm or sole involvement.  Lesions are blanching and not  significantly tender.  There are a few slightly raised and excoriated spots on her extremities.  There are no petechiae or purpura.   ED Results / Procedures / Treatments   Labs (all labs ordered are listed, but only abnormal results are displayed) Labs Reviewed - No data to display   EKG    RADIOLOGY    PROCEDURES:  Critical Care performed: No  Procedures   MEDICATIONS ORDERED IN ED: Medications  predniSONE (DELTASONE) tablet 60 mg (60 mg Oral Given 07/31/22 2203)  diphenhydrAMINE (BENADRYL) capsule 50 mg (50 mg Oral Given 07/31/22 2203)     IMPRESSION / MDM / ASSESSMENT AND PLAN / ED COURSE  I reviewed the triage vital signs and the nursing notes.  37 year old female presents with acute onset of macular/urticarial rash to her torso and extremities.  The rash is blanching, not significantly tender, and there is no palm, sole, or mucosal involvement.  The patient is afebrile and other wise very well-appearing.  Differential diagnosis includes, but is not limited to, urticarial allergic type rash  versus other nonspecific dermatitis.  Patient's presentation is most consistent with acute, uncomplicated illness.  At this time there is no indication for lab workup or observation.  The patient is stable for discharge home.  I have given a dose of prednisone and Benadryl here for presumed allergic etiology.  I counseled the patient on the possible etiologies of her symptoms and on return precautions; she expressed understanding.  I have prescribed a course of prednisone for home.   FINAL CLINICAL IMPRESSION(S) / ED DIAGNOSES   Final diagnoses:  Rash     Rx / DC Orders   ED Discharge Orders          Ordered    predniSONE (DELTASONE) 20 MG tablet  Daily        07/31/22 2234    diphenhydrAMINE (BENADRYL ALLERGY) 25 mg capsule  Every 6 hours PRN        07/31/22 2234    hydrocortisone cream 0.5 %  2 times daily PRN        07/31/22 2234             Note:  This  document was prepared using Dragon voice recognition software and may include unintentional dictation errors.    Arta Silence, MD 07/31/22 2318

## 2022-07-31 NOTE — Discharge Instructions (Signed)
Your rash is likely due to an allergic reaction.  Take the prednisone for the next 4 days.  Use the Benadryl as needed up to every 6 hours for itching and burning.  You may apply the hydrocortisone to any areas of the rash that are more itchy or open.  Return to the ER for new, worsening, or persistent severe rash, spreading rash, rash or lesions on your palms or soles, any itching or lesions in your mouth, fever, or any other new or worsening symptoms that concern you.

## 2022-07-31 NOTE — ED Triage Notes (Signed)
Pt has a rash on lower legs with burning sensation.  No itching  no resp distress  sx began today.  Pt alert  speech clear.

## 2023-06-08 IMAGING — CR DG LUMBAR SPINE COMPLETE 4+V
1 series · 5 of 5 positions shown · non-contrast
Comparison: None.

CLINICAL DATA: Midline low back pain

EXAM:
LUMBAR SPINE - COMPLETE 4+ VIEW

[Series 1: dg lumbar spine complete 4 +v · 0.14mm/px · 5 of 5 slices shown]
[im 1/5]
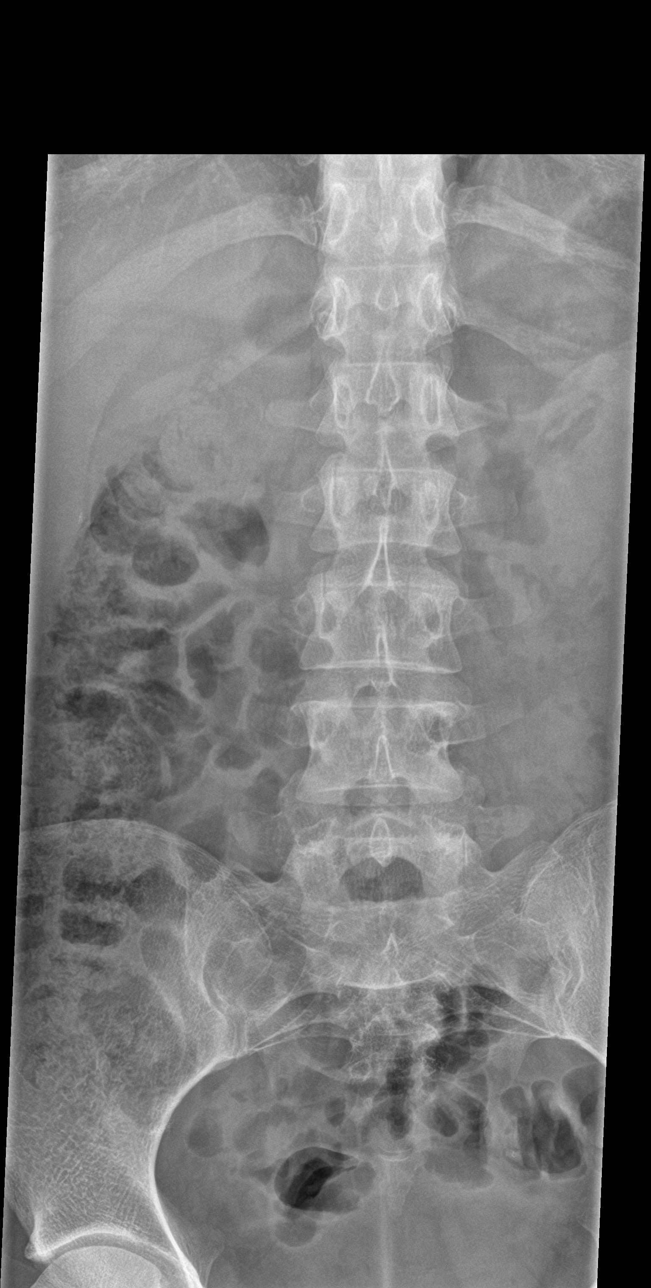
[im 2/5]
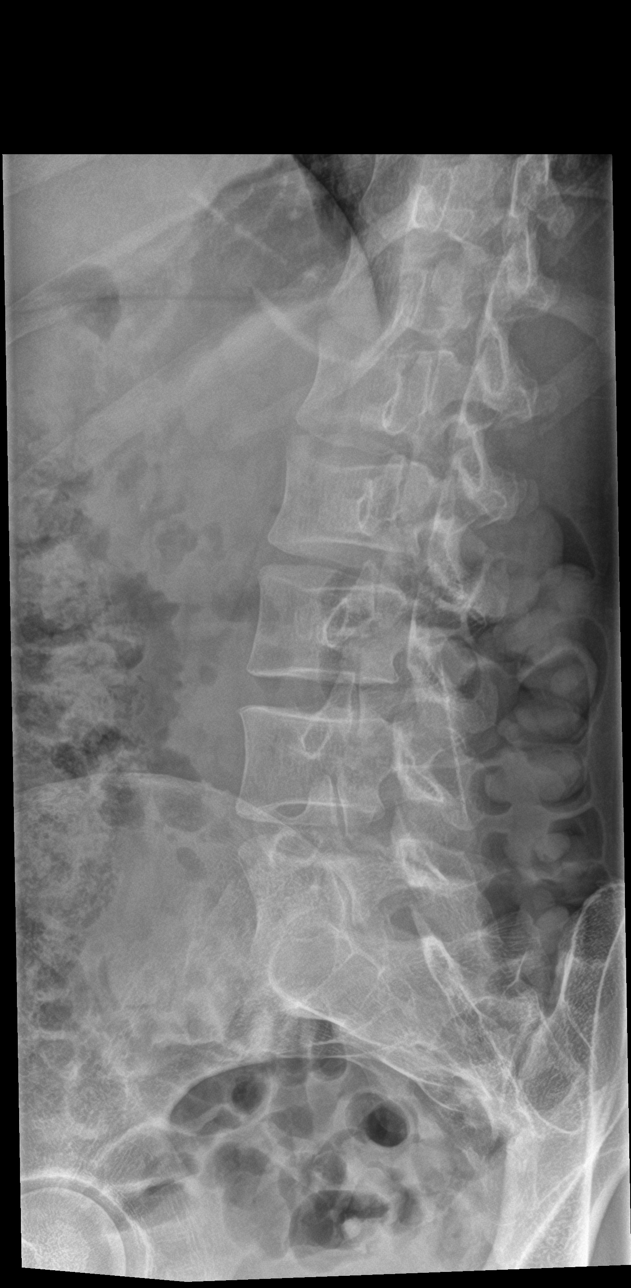
[im 3/5]
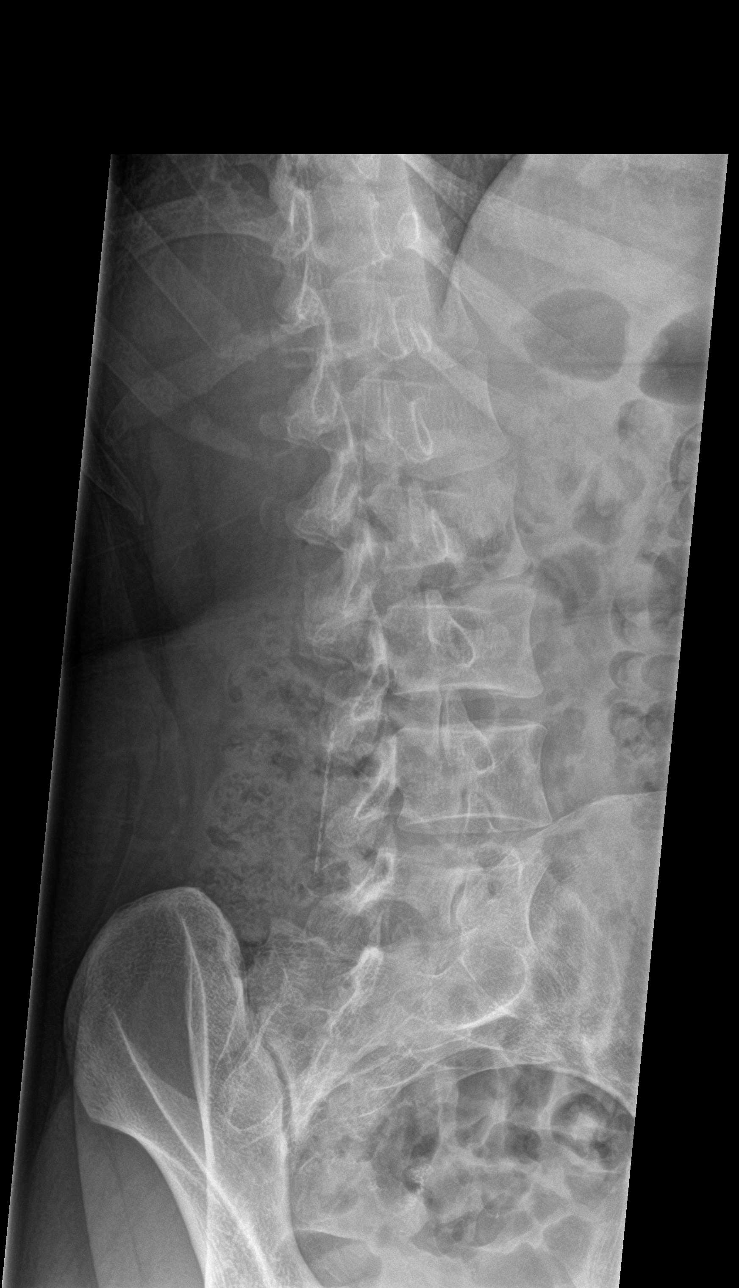
[im 4/5]
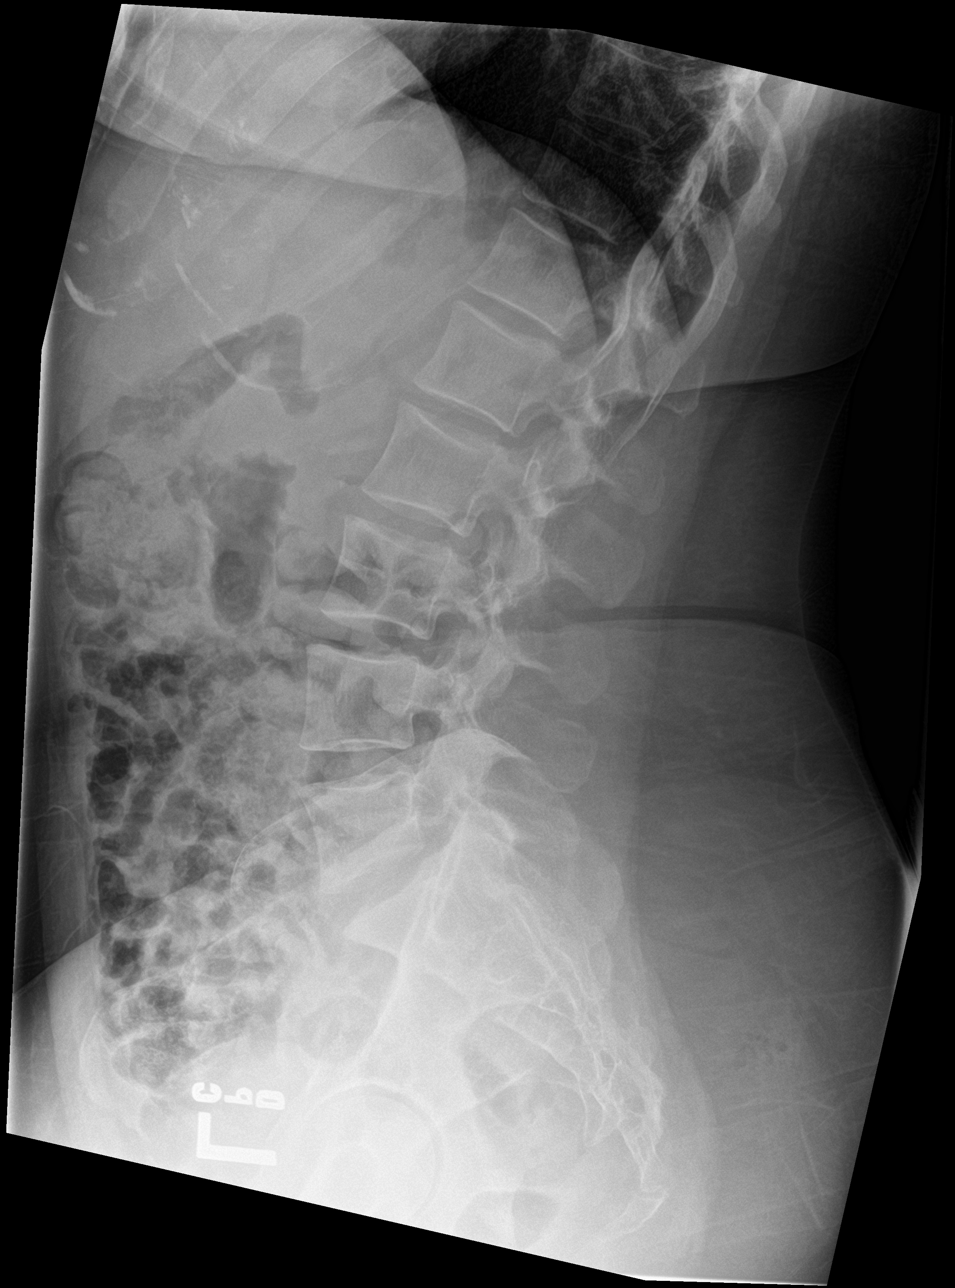
[im 5/5]
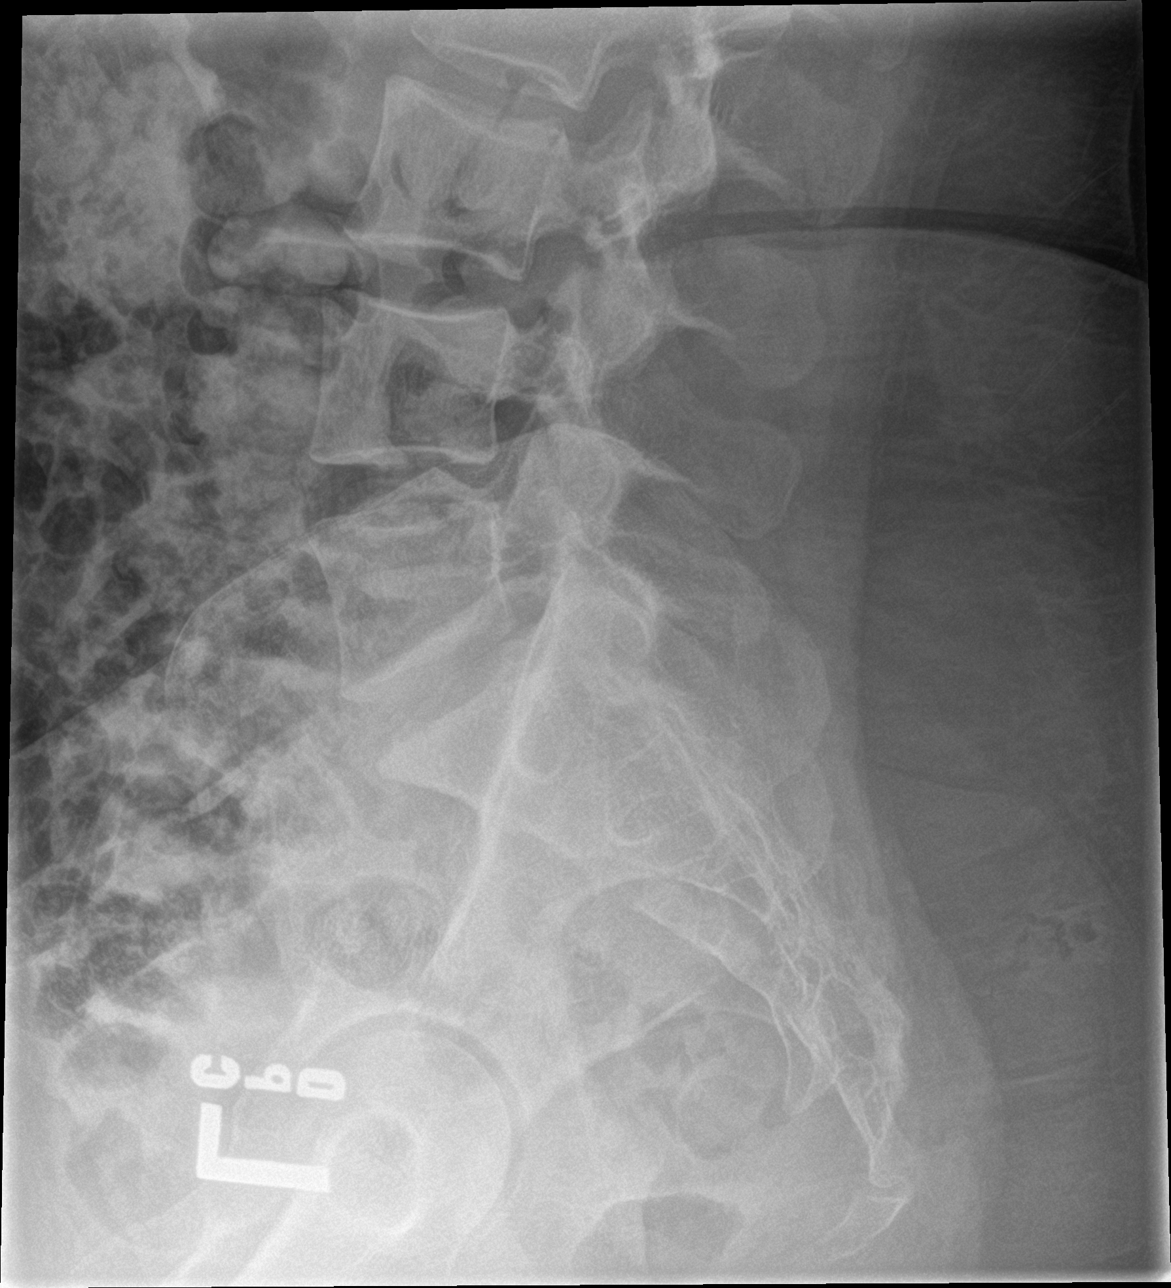

[5 of 5 positions shown; findings below may reference images not displayed]

FINDINGS: There is no evidence of lumbar spine fracture. Alignment is normal.
Intervertebral disc spaces are maintained.
IMPRESSION: Negative.

## 2023-09-28 ENCOUNTER — Encounter: Payer: Self-pay | Admitting: Intensive Care

## 2023-09-28 ENCOUNTER — Emergency Department
Admission: EM | Admit: 2023-09-28 | Discharge: 2023-09-28 | Disposition: A | Discharge status: Home / Self Care | Attending: Emergency Medicine | Admitting: Emergency Medicine

## 2023-09-28 ENCOUNTER — Other Ambulatory Visit: Payer: Self-pay

## 2023-09-28 DIAGNOSIS — R112 Nausea with vomiting, unspecified: Secondary | ICD-10-CM | POA: Insufficient documentation

## 2023-09-28 DIAGNOSIS — E860 Dehydration: Secondary | ICD-10-CM | POA: Diagnosis not present

## 2023-09-28 DIAGNOSIS — F419 Anxiety disorder, unspecified: Secondary | ICD-10-CM | POA: Insufficient documentation

## 2023-09-28 HISTORY — DX: Psoriasis, unspecified: L40.9

## 2023-09-28 HISTORY — DX: Thyrotoxicosis with diffuse goiter without thyrotoxic crisis or storm: E05.00

## 2023-09-28 LAB — CBC
HCT: 42.1 % (ref 36.0–46.0)
Hemoglobin: 14.7 g/dL (ref 12.0–15.0)
MCH: 30 pg (ref 26.0–34.0)
MCHC: 34.9 g/dL (ref 30.0–36.0)
MCV: 85.9 fL (ref 80.0–100.0)
Platelets: 244 10*3/uL (ref 150–400)
RBC: 4.9 MIL/uL (ref 3.87–5.11)
RDW: 12 % (ref 11.5–15.5)
WBC: 6.3 10*3/uL (ref 4.0–10.5)
nRBC: 0 % (ref 0.0–0.2)

## 2023-09-28 LAB — URINALYSIS, ROUTINE W REFLEX MICROSCOPIC
Bilirubin Urine: NEGATIVE
Glucose, UA: NEGATIVE mg/dL
Ketones, ur: 20 mg/dL — AB
Leukocytes,Ua: NEGATIVE
Nitrite: NEGATIVE
Protein, ur: 30 mg/dL — AB
Specific Gravity, Urine: 1.028 (ref 1.005–1.030)
pH: 5 (ref 5.0–8.0)

## 2023-09-28 LAB — COMPREHENSIVE METABOLIC PANEL WITH GFR
ALT: 15 U/L (ref 0–44)
AST: 18 U/L (ref 15–41)
Albumin: 4.5 g/dL (ref 3.5–5.0)
Alkaline Phosphatase: 85 U/L (ref 38–126)
Anion gap: 7 (ref 5–15)
BUN: 10 mg/dL (ref 6–20)
CO2: 23 mmol/L (ref 22–32)
Calcium: 9.4 mg/dL (ref 8.9–10.3)
Chloride: 109 mmol/L (ref 98–111)
Creatinine, Ser: 0.65 mg/dL (ref 0.44–1.00)
GFR, Estimated: >60 mL/min (ref >60)
Glucose, Bld: 97 mg/dL (ref 70–99)
Potassium: 3.4 mmol/L — ABNORMAL LOW (ref 3.5–5.1)
Sodium: 139 mmol/L (ref 135–145)
Total Bilirubin: 0.7 mg/dL (ref 0.0–1.2)
Total Protein: 7.4 g/dL (ref 6.5–8.1)

## 2023-09-28 LAB — T4, FREE: Free T4: 1.15 ng/dL — ABNORMAL HIGH (ref 0.61–1.12)

## 2023-09-28 LAB — POC URINE PREG, ED: Preg Test, Ur: NEGATIVE

## 2023-09-28 LAB — LIPASE, BLOOD: Lipase: 31 U/L (ref 11–51)

## 2023-09-28 LAB — TSH: TSH: 0.487 u[IU]/mL (ref 0.350–4.500)

## 2023-09-28 MED ORDER — ONDANSETRON HCL 4 MG/2ML IJ SOLN
4.0000 mg | Freq: Once | INTRAMUSCULAR | Status: AC
  Administered 2023-09-28: 4 mg via INTRAVENOUS
  Filled 2023-09-28: qty 2

## 2023-09-28 MED ORDER — ONDANSETRON 4 MG PO TBDP: 4.0000 mg | ORAL_TABLET | Freq: Three times a day (TID) | ORAL | 0 refills | Status: AC | PRN

## 2023-09-28 MED ORDER — KETOROLAC TROMETHAMINE 30 MG/ML IJ SOLN
15.0000 mg | Freq: Once | INTRAMUSCULAR | Status: AC
  Administered 2023-09-28: 15 mg via INTRAVENOUS
  Filled 2023-09-28: qty 1

## 2023-09-28 MED ORDER — LACTATED RINGERS IV BOLUS
1000.0000 mL | Freq: Once | INTRAVENOUS | Status: AC
Start: 2023-09-28 — End: 2023-09-28
  Administered 2023-09-28: 1000 mL via INTRAVENOUS

## 2023-09-28 NOTE — ED Notes (Signed)
 See triage note Presents with some n/v  this week  She also has had chills  Unsure of fever  but is afebrile on arrival

## 2023-09-28 NOTE — ED Triage Notes (Signed)
 Patient c/o chills, anxiety, sweating and emesis X1 week.   Patient takes methotrexate for psoriasis

## 2023-09-28 NOTE — ED Notes (Signed)
 Pt given sandwich tray and beverage. Pt appreciative.

## 2023-09-28 NOTE — ED Provider Notes (Signed)
 [facility] Provider Note    Event Date/Time   First MD Initiated Contact with Patient 09/28/23 [phone]     (approximate)   History   Chills and Anxiety   HPI  Maggie Senseney is a 38 y.o. female who presents to the ED for evaluation of Chills and Anxiety   I review 4/1 [facility] surgical oncology clinic visit, history of Graves' disease.  Plan for partial thyroidectomy this summer due to a nodule on the right.  Blood work was sent from this visit which I reviewed with a normal T4, slightly low TSH.  Also review a dermatology visit from 1 week ago.  History of psoriasis on methotrexate , previously Humira injections.  Patient presents to the ED for evaluation of about 1 week of generalized anxiety, nausea, recurrent emesis without abdominal pain and feeling unwell.  Denies any particular areas of pain such as chest or abdominal pain.  Reports feeling thirsty, dehydrated and a scratchy throat.  Nauseous without stool changes.  No urinary changes.  No fevers or syncope.   Physical Exam   Triage Vital Signs: ED Triage Vitals  Encounter Vitals Group     BP 09/28/23 0704 (!) 128/93     Systolic BP Percentile --      Diastolic BP Percentile --      Pulse Rate 09/28/23 0704 76     Resp 09/28/23 0704 18     Temp 09/28/23 0704 98.4 F (36.9 C)     Temp Source 09/28/23 0704 Oral     SpO2 09/28/23 0704 98 %     Weight 09/28/23 0745 173 lb 1 oz (78.5 kg)     Height 09/28/23 0705 5' (1.524 m)     Head Circumference --      Peak Flow --      Pain Score 09/28/23 0705 6     Pain Loc --      Pain Education --      Exclude from Growth Chart --     Most recent vital signs: Vitals:   09/28/23 0704 09/28/23 1021  BP: (!) 128/93 116/80  Pulse: 76 92  Resp: 18 17  Temp: 98.4 F (36.9 C)   SpO2: 98% 98%    General: Awake, no distress.  Well-appearing.  Dry mucous membranes. CV:  Good peripheral perfusion.  Resp:  Normal effort.  Abd:  No distention.  Soft and  benign throughout without any tenderness, guarding or peritoneal features. MSK:  No deformity noted.  Neuro:  No focal deficits appreciated. Other:     ED Results / Procedures / Treatments   Labs (all labs ordered are listed, but only abnormal results are displayed) Labs Reviewed  COMPREHENSIVE METABOLIC PANEL WITH GFR - Abnormal; Notable for the following components:      Result Value   Potassium 3.4 (*)    All other components within normal limits  URINALYSIS, ROUTINE W REFLEX MICROSCOPIC - Abnormal; Notable for the following components:   Color, Urine AMBER (*)    APPearance CLEAR (*)    Hgb urine dipstick SMALL (*)    Ketones, ur 20 (*)    Protein, ur 30 (*)    Bacteria, UA MANY (*)    All other components within normal limits  T4, FREE - Abnormal; Notable for the following components:   Free T4 1.15 (*)    All other components within normal limits  LIPASE, BLOOD  CBC  TSH  POC URINE PREG, ED  EKG   RADIOLOGY   Official radiology report(s): No results found.  PROCEDURES and INTERVENTIONS:  Procedures  Medications  lactated ringers  bolus 1,000 mL (0 mLs Intravenous Stopped 09/28/23 1020)  ondansetron  (ZOFRAN ) injection 4 mg (4 mg Intravenous Given 09/28/23 0814)  ketorolac  (TORADOL ) 30 MG/ML injection 15 mg (15 mg Intravenous Given 09/28/23 0813)     IMPRESSION / MDM / ASSESSMENT AND PLAN / ED COURSE  I reviewed the triage vital signs and the nursing notes.  Differential diagnosis includes, but is not limited to, viral syndrome such as gastroenteritis, dehydration, Graves' disease causing hyperthyroidism, pancreatitis, biliary colic, UTI, pregnancy  {Patient presents with symptoms of an acute illness or injury that is potentially life-threatening.  Patient presents with about 1 week of recurrent emesis, chills and anxiety.  Looks well to me, though dry.  Benign abdomen.  Workup with signs of dehydration with ketones in the urine but no signs of UTI.   Normal CBC, lipase and metabolic panel.  Negative pregnancy.  Awaiting repeat TSH/T4.  Will provide IV fluids, antiemetics, Toradol  and reassess.  TSH and T4 are generally reassuring, TSH is normal and free T4 is just marginally elevated but I doubt this is causing a significant thyrotoxicosis.  More likely a viral etiology causing dehydration.  She has resolving symptoms and tolerating p.o. after IV antiemetics, fluids and Toradol .  She is suitable for outpatient management.  Clinical Course as of 09/28/23 1241  Fri Sep 28, 2023  3244 Reassessedm feeling better.  [DS]    Clinical Course User Index [DS] Arline Bennett, MD     FINAL CLINICAL IMPRESSION(S) / ED DIAGNOSES   Final diagnoses:  Nausea and vomiting, unspecified vomiting type  Anxiety  Dehydration     Rx / DC Orders   ED Discharge Orders          Ordered    ondansetron  (ZOFRAN -ODT) 4 MG disintegrating tablet  Every 8 hours PRN        09/28/23 0936             Note:  This document was prepared using Dragon voice recognition software and may include unintentional dictation errors.   Arline Bennett, MD 09/28/23 [phone]

## 2023-10-11 ENCOUNTER — Other Ambulatory Visit: Payer: Self-pay

## 2023-10-11 DIAGNOSIS — R1011 Right upper quadrant pain: Secondary | ICD-10-CM

## 2023-10-11 DIAGNOSIS — R12 Heartburn: Secondary | ICD-10-CM

## 2023-10-17 ENCOUNTER — Ambulatory Visit
Admission: RE | Admit: 2023-10-17 | Discharge: 2023-10-17 | Disposition: A | Discharge status: Home / Self Care | Source: Ambulatory Visit

## 2023-10-17 DIAGNOSIS — R12 Heartburn: Secondary | ICD-10-CM | POA: Diagnosis present

## 2023-10-17 DIAGNOSIS — R1011 Right upper quadrant pain: Secondary | ICD-10-CM | POA: Insufficient documentation
